# Patient Record
Sex: Male | Born: 1965
Health system: Southern US, Community
[De-identification: ages and names within clinical notes are randomized; demographics above are authoritative.]

## PROBLEM LIST (undated history)

## (undated) DIAGNOSIS — F319 Bipolar disorder, unspecified: Secondary | ICD-10-CM

## (undated) DIAGNOSIS — I10 Essential (primary) hypertension: Secondary | ICD-10-CM

## (undated) DIAGNOSIS — R001 Bradycardia, unspecified: Secondary | ICD-10-CM

## (undated) DIAGNOSIS — G4733 Obstructive sleep apnea (adult) (pediatric): Secondary | ICD-10-CM

## (undated) HISTORY — DX: Bipolar disorder, unspecified: F31.9

## (undated) HISTORY — DX: Obstructive sleep apnea (adult) (pediatric): G47.33

## (undated) HISTORY — PX: KNEE SURGERY: SHX244

---

## 2002-12-22 ENCOUNTER — Encounter: Payer: Self-pay | Admitting: Family Medicine

## 2002-12-22 ENCOUNTER — Ambulatory Visit (HOSPITAL_COMMUNITY): Admission: RE | Admit: 2002-12-22 | Discharge: 2002-12-22 | Payer: Self-pay | Admitting: Family Medicine

## 2006-05-31 ENCOUNTER — Emergency Department (HOSPITAL_COMMUNITY): Admission: EM | Admit: 2006-05-31 | Discharge: 2006-05-31 | Payer: Self-pay | Admitting: Emergency Medicine

## 2016-01-06 DIAGNOSIS — D1801 Hemangioma of skin and subcutaneous tissue: Secondary | ICD-10-CM | POA: Diagnosis not present

## 2016-01-06 DIAGNOSIS — D235 Other benign neoplasm of skin of trunk: Secondary | ICD-10-CM | POA: Diagnosis not present

## 2016-01-06 DIAGNOSIS — L814 Other melanin hyperpigmentation: Secondary | ICD-10-CM | POA: Diagnosis not present

## 2016-01-06 DIAGNOSIS — L821 Other seborrheic keratosis: Secondary | ICD-10-CM | POA: Diagnosis not present

## 2016-01-06 DIAGNOSIS — L570 Actinic keratosis: Secondary | ICD-10-CM | POA: Diagnosis not present

## 2016-02-22 DIAGNOSIS — Z9622 Myringotomy tube(s) status: Secondary | ICD-10-CM | POA: Diagnosis not present

## 2016-02-22 DIAGNOSIS — H6122 Impacted cerumen, left ear: Secondary | ICD-10-CM | POA: Diagnosis not present

## 2016-02-22 DIAGNOSIS — H9211 Otorrhea, right ear: Secondary | ICD-10-CM | POA: Diagnosis not present

## 2016-02-25 DIAGNOSIS — E782 Mixed hyperlipidemia: Secondary | ICD-10-CM | POA: Diagnosis not present

## 2016-02-25 DIAGNOSIS — I10 Essential (primary) hypertension: Secondary | ICD-10-CM | POA: Diagnosis not present

## 2016-03-28 DIAGNOSIS — G4733 Obstructive sleep apnea (adult) (pediatric): Secondary | ICD-10-CM | POA: Diagnosis not present

## 2016-05-29 DIAGNOSIS — Z23 Encounter for immunization: Secondary | ICD-10-CM | POA: Diagnosis not present

## 2016-05-29 DIAGNOSIS — E782 Mixed hyperlipidemia: Secondary | ICD-10-CM | POA: Diagnosis not present

## 2016-05-29 DIAGNOSIS — I10 Essential (primary) hypertension: Secondary | ICD-10-CM | POA: Diagnosis not present

## 2016-05-29 DIAGNOSIS — H6120 Impacted cerumen, unspecified ear: Secondary | ICD-10-CM | POA: Diagnosis not present

## 2016-05-29 DIAGNOSIS — M79671 Pain in right foot: Secondary | ICD-10-CM | POA: Diagnosis not present

## 2016-06-05 DIAGNOSIS — H6522 Chronic serous otitis media, left ear: Secondary | ICD-10-CM | POA: Diagnosis not present

## 2016-06-05 DIAGNOSIS — H9211 Otorrhea, right ear: Secondary | ICD-10-CM | POA: Diagnosis not present

## 2016-06-05 DIAGNOSIS — Z9622 Myringotomy tube(s) status: Secondary | ICD-10-CM | POA: Diagnosis not present

## 2016-07-18 DIAGNOSIS — G4733 Obstructive sleep apnea (adult) (pediatric): Secondary | ICD-10-CM | POA: Diagnosis not present

## 2016-08-24 DIAGNOSIS — I10 Essential (primary) hypertension: Secondary | ICD-10-CM | POA: Diagnosis not present

## 2016-08-24 DIAGNOSIS — F3181 Bipolar II disorder: Secondary | ICD-10-CM | POA: Diagnosis not present

## 2016-08-24 DIAGNOSIS — E782 Mixed hyperlipidemia: Secondary | ICD-10-CM | POA: Diagnosis not present

## 2016-08-24 DIAGNOSIS — M25521 Pain in right elbow: Secondary | ICD-10-CM | POA: Diagnosis not present

## 2016-11-13 DIAGNOSIS — G4733 Obstructive sleep apnea (adult) (pediatric): Secondary | ICD-10-CM | POA: Diagnosis not present

## 2016-11-28 DIAGNOSIS — Z125 Encounter for screening for malignant neoplasm of prostate: Secondary | ICD-10-CM | POA: Diagnosis not present

## 2016-11-28 DIAGNOSIS — I10 Essential (primary) hypertension: Secondary | ICD-10-CM | POA: Diagnosis not present

## 2016-11-28 DIAGNOSIS — F3181 Bipolar II disorder: Secondary | ICD-10-CM | POA: Diagnosis not present

## 2016-11-28 DIAGNOSIS — Z0000 Encounter for general adult medical examination without abnormal findings: Secondary | ICD-10-CM | POA: Diagnosis not present

## 2016-11-28 DIAGNOSIS — R001 Bradycardia, unspecified: Secondary | ICD-10-CM | POA: Diagnosis not present

## 2016-11-28 DIAGNOSIS — E782 Mixed hyperlipidemia: Secondary | ICD-10-CM | POA: Diagnosis not present

## 2016-12-07 DIAGNOSIS — D225 Melanocytic nevi of trunk: Secondary | ICD-10-CM | POA: Diagnosis not present

## 2016-12-07 DIAGNOSIS — B353 Tinea pedis: Secondary | ICD-10-CM | POA: Diagnosis not present

## 2016-12-07 DIAGNOSIS — L814 Other melanin hyperpigmentation: Secondary | ICD-10-CM | POA: Diagnosis not present

## 2016-12-07 DIAGNOSIS — L821 Other seborrheic keratosis: Secondary | ICD-10-CM | POA: Diagnosis not present

## 2017-03-01 DIAGNOSIS — H6122 Impacted cerumen, left ear: Secondary | ICD-10-CM | POA: Diagnosis not present

## 2017-03-26 DIAGNOSIS — H01004 Unspecified blepharitis left upper eyelid: Secondary | ICD-10-CM | POA: Diagnosis not present

## 2017-04-23 DIAGNOSIS — R42 Dizziness and giddiness: Secondary | ICD-10-CM | POA: Diagnosis not present

## 2017-04-23 DIAGNOSIS — F3181 Bipolar II disorder: Secondary | ICD-10-CM | POA: Diagnosis not present

## 2017-04-23 DIAGNOSIS — R001 Bradycardia, unspecified: Secondary | ICD-10-CM | POA: Diagnosis not present

## 2017-05-15 DIAGNOSIS — I10 Essential (primary) hypertension: Secondary | ICD-10-CM | POA: Diagnosis not present

## 2017-05-15 DIAGNOSIS — R42 Dizziness and giddiness: Secondary | ICD-10-CM | POA: Diagnosis not present

## 2017-05-15 DIAGNOSIS — R001 Bradycardia, unspecified: Secondary | ICD-10-CM | POA: Diagnosis not present

## 2017-05-15 DIAGNOSIS — G4733 Obstructive sleep apnea (adult) (pediatric): Secondary | ICD-10-CM | POA: Diagnosis not present

## 2017-05-16 DIAGNOSIS — R42 Dizziness and giddiness: Secondary | ICD-10-CM | POA: Diagnosis not present

## 2017-05-18 DIAGNOSIS — R001 Bradycardia, unspecified: Secondary | ICD-10-CM | POA: Diagnosis not present

## 2017-05-24 DIAGNOSIS — R0602 Shortness of breath: Secondary | ICD-10-CM | POA: Diagnosis not present

## 2017-05-31 DIAGNOSIS — Z23 Encounter for immunization: Secondary | ICD-10-CM | POA: Diagnosis not present

## 2017-05-31 DIAGNOSIS — E782 Mixed hyperlipidemia: Secondary | ICD-10-CM | POA: Diagnosis not present

## 2017-05-31 DIAGNOSIS — I10 Essential (primary) hypertension: Secondary | ICD-10-CM | POA: Diagnosis not present

## 2017-05-31 DIAGNOSIS — F3181 Bipolar II disorder: Secondary | ICD-10-CM | POA: Diagnosis not present

## 2017-06-15 DIAGNOSIS — R001 Bradycardia, unspecified: Secondary | ICD-10-CM | POA: Diagnosis not present

## 2017-06-15 DIAGNOSIS — R42 Dizziness and giddiness: Secondary | ICD-10-CM | POA: Diagnosis not present

## 2017-06-26 DIAGNOSIS — H5203 Hypermetropia, bilateral: Secondary | ICD-10-CM | POA: Diagnosis not present

## 2017-06-26 DIAGNOSIS — H524 Presbyopia: Secondary | ICD-10-CM | POA: Diagnosis not present

## 2017-06-26 DIAGNOSIS — H1789 Other corneal scars and opacities: Secondary | ICD-10-CM | POA: Diagnosis not present

## 2017-07-19 DIAGNOSIS — G4733 Obstructive sleep apnea (adult) (pediatric): Secondary | ICD-10-CM | POA: Diagnosis not present

## 2017-09-13 DIAGNOSIS — G4733 Obstructive sleep apnea (adult) (pediatric): Secondary | ICD-10-CM | POA: Diagnosis not present

## 2017-12-03 DIAGNOSIS — Z1211 Encounter for screening for malignant neoplasm of colon: Secondary | ICD-10-CM | POA: Diagnosis not present

## 2017-12-03 DIAGNOSIS — Z125 Encounter for screening for malignant neoplasm of prostate: Secondary | ICD-10-CM | POA: Diagnosis not present

## 2017-12-03 DIAGNOSIS — I10 Essential (primary) hypertension: Secondary | ICD-10-CM | POA: Diagnosis not present

## 2017-12-03 DIAGNOSIS — R001 Bradycardia, unspecified: Secondary | ICD-10-CM | POA: Diagnosis not present

## 2017-12-03 DIAGNOSIS — F3181 Bipolar II disorder: Secondary | ICD-10-CM | POA: Diagnosis not present

## 2017-12-03 DIAGNOSIS — E782 Mixed hyperlipidemia: Secondary | ICD-10-CM | POA: Diagnosis not present

## 2017-12-03 DIAGNOSIS — Z0000 Encounter for general adult medical examination without abnormal findings: Secondary | ICD-10-CM | POA: Diagnosis not present

## 2017-12-20 DIAGNOSIS — I119 Hypertensive heart disease without heart failure: Secondary | ICD-10-CM | POA: Diagnosis not present

## 2017-12-20 DIAGNOSIS — R001 Bradycardia, unspecified: Secondary | ICD-10-CM | POA: Diagnosis not present

## 2017-12-20 DIAGNOSIS — E785 Hyperlipidemia, unspecified: Secondary | ICD-10-CM | POA: Diagnosis not present

## 2018-01-01 DIAGNOSIS — H9212 Otorrhea, left ear: Secondary | ICD-10-CM | POA: Diagnosis not present

## 2018-01-01 DIAGNOSIS — Z9622 Myringotomy tube(s) status: Secondary | ICD-10-CM | POA: Diagnosis not present

## 2018-03-21 DIAGNOSIS — L819 Disorder of pigmentation, unspecified: Secondary | ICD-10-CM | POA: Diagnosis not present

## 2018-03-21 DIAGNOSIS — D225 Melanocytic nevi of trunk: Secondary | ICD-10-CM | POA: Diagnosis not present

## 2018-03-21 DIAGNOSIS — L821 Other seborrheic keratosis: Secondary | ICD-10-CM | POA: Diagnosis not present

## 2018-04-11 ENCOUNTER — Emergency Department (HOSPITAL_COMMUNITY)
Admission: EM | Admit: 2018-04-11 | Discharge: 2018-04-12 | Disposition: A | Discharge status: Home / Self Care | Payer: BLUE CROSS/BLUE SHIELD | Attending: Emergency Medicine | Admitting: Emergency Medicine

## 2018-04-11 ENCOUNTER — Emergency Department (HOSPITAL_COMMUNITY): Payer: BLUE CROSS/BLUE SHIELD

## 2018-04-11 ENCOUNTER — Encounter (HOSPITAL_COMMUNITY): Payer: Self-pay | Admitting: Emergency Medicine

## 2018-04-11 ENCOUNTER — Other Ambulatory Visit: Payer: Self-pay

## 2018-04-11 DIAGNOSIS — K7689 Other specified diseases of liver: Secondary | ICD-10-CM | POA: Diagnosis not present

## 2018-04-11 DIAGNOSIS — M546 Pain in thoracic spine: Secondary | ICD-10-CM | POA: Diagnosis not present

## 2018-04-11 DIAGNOSIS — R079 Chest pain, unspecified: Secondary | ICD-10-CM | POA: Diagnosis not present

## 2018-04-11 DIAGNOSIS — M549 Dorsalgia, unspecified: Secondary | ICD-10-CM | POA: Diagnosis not present

## 2018-04-11 DIAGNOSIS — R1084 Generalized abdominal pain: Secondary | ICD-10-CM | POA: Diagnosis not present

## 2018-04-11 DIAGNOSIS — R112 Nausea with vomiting, unspecified: Secondary | ICD-10-CM | POA: Diagnosis not present

## 2018-04-11 DIAGNOSIS — K8020 Calculus of gallbladder without cholecystitis without obstruction: Secondary | ICD-10-CM | POA: Insufficient documentation

## 2018-04-11 DIAGNOSIS — R1010 Upper abdominal pain, unspecified: Secondary | ICD-10-CM | POA: Diagnosis not present

## 2018-04-11 DIAGNOSIS — R1011 Right upper quadrant pain: Secondary | ICD-10-CM | POA: Diagnosis not present

## 2018-04-11 HISTORY — DX: Essential (primary) hypertension: I10

## 2018-04-11 HISTORY — DX: Bradycardia, unspecified: R00.1

## 2018-04-11 LAB — CBC
HCT: 43.3 % (ref 39.0–52.0)
HEMOGLOBIN: 14.5 g/dL (ref 13.0–17.0)
MCH: 29.5 pg (ref 26.0–34.0)
MCHC: 33.5 g/dL (ref 30.0–36.0)
MCV: 88.2 fL (ref 78.0–100.0)
PLATELETS: 193 10*3/uL (ref 150–400)
RBC: 4.91 MIL/uL (ref 4.22–5.81)
RDW: 12.7 % (ref 11.5–15.5)
WBC: 8.6 10*3/uL (ref 4.0–10.5)

## 2018-04-11 LAB — COMPREHENSIVE METABOLIC PANEL
ALK PHOS: 80 U/L (ref 38–126)
ALT: 43 U/L (ref 0–44)
ANION GAP: 7 (ref 5–15)
AST: 25 U/L (ref 15–41)
Albumin: 4.5 g/dL (ref 3.5–5.0)
BILIRUBIN TOTAL: 1.1 mg/dL (ref 0.3–1.2)
BUN: 16 mg/dL (ref 6–20)
CALCIUM: 9.6 mg/dL (ref 8.9–10.3)
CO2: 24 mmol/L (ref 22–32)
Chloride: 108 mmol/L (ref 98–111)
Creatinine, Ser: 1.01 mg/dL (ref 0.61–1.24)
GFR calc non Af Amer: >60 mL/min (ref >60)
Glucose, Bld: 158 mg/dL — ABNORMAL HIGH (ref 70–99)
Potassium: 3.6 mmol/L (ref 3.5–5.1)
Sodium: 139 mmol/L (ref 135–145)
TOTAL PROTEIN: 8 g/dL (ref 6.5–8.1)

## 2018-04-11 LAB — TROPONIN I: Troponin I: <0.03 ng/mL (ref <0.03)

## 2018-04-11 LAB — URINALYSIS, ROUTINE W REFLEX MICROSCOPIC
BILIRUBIN URINE: NEGATIVE
Bacteria, UA: NONE SEEN
Glucose, UA: NEGATIVE mg/dL
KETONES UR: NEGATIVE mg/dL
Leukocytes, UA: NEGATIVE
NITRITE: NEGATIVE
PH: 6 (ref 5.0–8.0)
Protein, ur: NEGATIVE mg/dL
Specific Gravity, Urine: 1.006 (ref 1.005–1.030)

## 2018-04-11 LAB — LIPASE, BLOOD: Lipase: 35 U/L (ref 11–51)

## 2018-04-11 MED ORDER — HYDROCODONE-ACETAMINOPHEN 5-325 MG PO TABS: 1.0000 | ORAL_TABLET | Freq: Four times a day (QID) | ORAL | 0 refills | Status: DC | PRN

## 2018-04-11 MED ORDER — IOPAMIDOL (ISOVUE-370) INJECTION 76%
100.0000 mL | Freq: Once | INTRAVENOUS | Status: AC | PRN
  Administered 2018-04-11: 100 mL via INTRAVENOUS

## 2018-04-11 MED ORDER — HYDROCODONE-ACETAMINOPHEN 5-325 MG PO TABS
1.0000 | ORAL_TABLET | Freq: Once | ORAL | Status: AC
  Administered 2018-04-11: 1 via ORAL
  Filled 2018-04-11: qty 1

## 2018-04-11 MED ORDER — IOPAMIDOL (ISOVUE-370) INJECTION 76%
INTRAVENOUS | Status: DC
Start: 2018-04-11 — End: 2018-04-12
  Filled 2018-04-11: qty 100

## 2018-04-11 MED ORDER — SODIUM CHLORIDE 0.9 % IV SOLN
INTRAVENOUS | Status: DC
  Administered 2018-04-11: 20:00:00 via INTRAVENOUS

## 2018-04-11 NOTE — ED Notes (Signed)
Pt stated he does feel short of breath as well

## 2018-04-11 NOTE — ED Triage Notes (Signed)
Patient c/o generalized abdominal pain and thoracic back pain since Monday. Reports N/V on Monday. Denies diarrhea and urinary sx.

## 2018-04-11 NOTE — Discharge Instructions (Addendum)
Call surgery in the morning for a follow-up appointment.  Try to avoid fatty foods.  Hydrocodone provided for pain.  If pain persist for several hours and does not resolve return for evaluation.

## 2018-04-11 NOTE — ED Provider Notes (Addendum)
[facility]-EMERGENCY DEPT Provider Note   CSN: [id number] Arrival date & time: 04/11/18  1627     History   Chief Complaint Chief Complaint  Patient presents with  . Abdominal Pain  . Back Pain    HPI Nicholas Riley is a 52 y.o. male.  Patient with onset of upper abdominal pain left and right radiating to the thoracic back area that started on Monday.  On Monday it was more severe and there was episodes of nausea and vomiting no diarrhea no urinary symptoms no past history of similar pain.  Patient seemed to be in the junction between the chest and the upper abdomen and radiated to the back.  No fevers.  No further nausea or vomiting.  Still has discomfort in that area.     Past Medical History:  Diagnosis Date  . Bradycardia   . Hypertension     There are no active problems to display for this patient.   Past Surgical History:  Procedure Laterality Date  . KNEE SURGERY          Home Medications    Prior to Admission medications   Medication Sig Start Date End Date Taking? Authorizing Provider  amLODipine (NORVASC) 5 MG tablet Take 5 mg by mouth daily. 12/13/17  Yes [provider]  fenofibrate 160 MG tablet Take 160 mg by mouth daily. 12/08/16  Yes [provider]  fluticasone (FLONASE) 50 MCG/ACT nasal spray Place 2 sprays into both nostrils daily.   Yes [provider]  ibuprofen (ADVIL,MOTRIN) 200 MG tablet Take 400 mg by mouth daily as needed for moderate pain.   Yes [provider]  lamoTRIgine (LAMICTAL) 100 MG tablet Take 100 mg by mouth daily. 11/28/16  Yes [provider]  lithium carbonate 300 MG capsule Take 300 mg by mouth daily. 11/28/16  Yes [provider]  losartan (COZAAR) 100 MG tablet Take 100 mg by mouth daily. 12/08/16  Yes [provider]  HYDROcodone-acetaminophen (NORCO/VICODIN) 5-325 MG tablet Take 1 tablet by mouth every 6 (six) hours as needed for moderate  pain. 04/11/18   Vanetta Mulders, MD    Family History No family history on file.  Social History Social History   Tobacco Use  . Smoking status: Not on file  Substance Use Topics  . Alcohol use: Not on file  . Drug use: Not on file     Allergies   Patient has no known allergies.   Review of Systems Review of Systems  Constitutional: Negative for fever.  HENT: Negative for congestion.   Eyes: Negative for redness and visual disturbance.  Respiratory: Negative for shortness of breath.   Cardiovascular: Positive for chest pain.  Gastrointestinal: Positive for abdominal pain, nausea and vomiting.  Genitourinary: Negative for dysuria and hematuria.  Musculoskeletal: Positive for back pain. Negative for neck pain.  Skin: Negative for rash.  Neurological: Negative for syncope and headaches.  Hematological: Does not bruise/bleed easily.  Psychiatric/Behavioral: Negative for confusion.     Physical Exam Updated Vital Signs BP 116/89 (BP Location: Left Arm)   Pulse (!) 54   Temp 98.4 F (36.9 C) (Oral)   Resp 16   Ht 1.854 m (6\' 1" )   Wt 120.2 kg (265 lb)   SpO2 99%   BMI 34.96 kg/m   Physical Exam  Constitutional: He is oriented to person, place, and time. He appears well-developed and well-nourished. No distress.  HENT:  Head: Normocephalic and atraumatic.  Eyes: Pupils  are equal, round, and reactive to light. Conjunctivae and EOM are normal.  Neck: Neck supple.  Cardiovascular: Normal rate, regular rhythm and normal heart sounds.  Pulmonary/Chest: Effort normal and breath sounds normal.  Abdominal: Soft. Bowel sounds are normal. There is tenderness.  Mild tenderness to palpation right upper quadrant.  No guarding  Musculoskeletal: Normal range of motion. He exhibits no edema.  Neurological: He is alert and oriented to person, place, and time. No cranial nerve deficit or sensory deficit. He exhibits normal muscle tone. Coordination normal.  Skin: Skin is warm.    Nursing note and vitals reviewed.    ED Treatments / Results  Labs (all labs ordered are listed, but only abnormal results are displayed) Labs Reviewed  COMPREHENSIVE METABOLIC PANEL - Abnormal; Notable for the following components:      Result Value   Glucose, Bld 158 (*)    All other components within normal limits  URINALYSIS, ROUTINE W REFLEX MICROSCOPIC - Abnormal; Notable for the following components:   Color, Urine STRAW (*)    Hgb urine dipstick SMALL (*)    All other components within normal limits  LIPASE, BLOOD  CBC  TROPONIN I    EKG EKG Interpretation  Date/Time:  Thursday April 11 2018 19:56:15 EDT Ventricular Rate:  51 PR Interval:    QRS Duration: 116 QT Interval:  459 QTC Calculation: 423 R Axis:   -43 Text Interpretation:  Sinus rhythm Incomplete left bundle branch block No significant change since last tracing Confirmed by Vanetta MuldersZackowski, Rian Koon [id number]) on 04/11/2018 8:24:43 PM   Radiology [place] Abdomen Limited  Result Date: 04/11/2018 CLINICAL DATA:  Right upper quadrant pain EXAM: ULTRASOUND ABDOMEN LIMITED RIGHT UPPER QUADRANT COMPARISON:  None. FINDINGS: Gallbladder: There is a single mobile gallstone that measures up to 1.1 cm. No pericholecystic fluid or gallbladder wall thickening. Negative sonographic Eulah PontMurphy sign was reported by the sonographer. Common bile duct: Diameter: 5 mm Liver: Liver parenchyma is hyperechoic. Portal vein is patent on color Doppler imaging with normal direction of blood flow towards the liver. IMPRESSION: 1. Cholelithiasis without other evidence of acute cholecystitis. 2. Hepatic steatosis. Electronically Signed   By: Deatra RobinsonKevin  Herman M.D.   On: 04/11/2018 22:01   Ct Angio Chest/abd/pel For Dissection W And/or W/wo  Result Date: 04/11/2018 CLINICAL DATA:  52 year old male with generalized abdominal pain, shortness and thoracic pain since Monday. Nausea and vomiting. EXAM: CT ANGIOGRAPHY CHEST, ABDOMEN AND PELVIS TECHNIQUE: Multidetector  CT imaging through the chest, abdomen and pelvis was performed using the standard protocol during bolus administration of intravenous contrast. Multiplanar reconstructed images and MIPs were obtained and reviewed to evaluate the vascular anatomy. CONTRAST:  100mL ISOVUE-370 IOPAMIDOL (ISOVUE-370) INJECTION 76% COMPARISON:  None. FINDINGS: CTA CHEST FINDINGS Cardiovascular: Conventional branch pattern of the great vessels without dissection nor aneurysm. Nonaneurysmal thoracic aorta without evidence of dissection. Motion related artifacts noted along the ascending thoracic aorta and aortic root slightly limits assessment. No large central pulmonary embolus identified to the proximal segmental levels. Heart size is normal without pericardial effusion. Coronary arteries demonstrate no significant calcific arteriosclerosis. Mediastinum/Nodes: Hypodense nodules of the left thyroid lobe, the largest in the midpole measuring up to 22 x 19 mm. Midline patent trachea and mainstem bronchi. No lymphadenopathy. Lungs/Pleura: 3.4 mm in average right lo upper lobe posterior pleural-based nodular density potentially postinflammatory or infectious in etiology. Neoplasm believed less likely. No pulmonary consolidation, effusion or pneumothorax. Musculoskeletal: No chest wall abnormality. No acute or significant osseous findings. Review of the MIP  images confirms the above findings. CTA ABDOMEN AND PELVIS FINDINGS VASCULAR Aorta: Normal caliber aorta without aneurysm, dissection, vasculitis or significant stenosis. Celiac: Patent without evidence of aneurysm, dissection, vasculitis or significant stenosis. SMA: Patent without evidence of aneurysm, dissection, vasculitis or significant stenosis. Renals: Both renal arteries are patent without evidence of aneurysm, dissection, vasculitis, fibromuscular dysplasia or significant stenosis. IMA: Patent without evidence of aneurysm, dissection, vasculitis or significant stenosis. Inflow:  Patent without evidence of aneurysm, dissection, vasculitis or significant stenosis. Veins: No obvious venous abnormality within the limitations of this arterial phase study. Review of the MIP images confirms the above findings. NON-VASCULAR Hepatobiliary: Steatosis of the liver. Gallbladder wall thickening with pericholecystic inflammation and gallbladder distention is identified. No gallstones however are noted. Pancreas: Normal Spleen: Normal Adrenals/Urinary Tract: Normal bilateral adrenal glands. Tiny too small to characterize hypodensities within both kidneys compatible with cysts. No obstructive uropathy. The urinary bladder is physiologically distended without focal mural thickening or calculi. Stomach/Bowel: The stomach is decompressed. Normal small bowel rotation is identified. Normal appendix. Average amount of fecal retention within the colon without inflammation or obstruction. Scattered colonic diverticulosis is noted without acute diverticulitis. Lymphatic: No lymphadenopathy by CT size criteria. Reproductive: Normal size prostate and seminal vesicles. Other: Fat containing left inguinal canal and small fat containing umbilical hernia. Musculoskeletal: L5-S1 degenerative disc disease. Lipoma of the left pectineus muscle measuring 3.1 x 5.6 x 2.3 cm Review of the MIP images confirms the above findings. IMPRESSION: Vascular: No acute aortic dissection, aneurysm or pulmonary embolus. Nonvascular: 1. Distended gallbladder with mild pericholecystic inflammatory change suspicious for an acalculous cholecystitis. 2. Hepatic steatosis. 3. Subcentimeter hypodensities within both kidneys statistically consistent with cysts but too small to further characterize. No obstructive uropathy. 4. Scattered colonic diverticulosis without acute diverticulitis. 5. Lipoma of the left pectineus muscle. Electronically Signed   By: Tollie Eth M.D.   On: 04/11/2018 20:46    Procedures Procedures (including critical care  time)  Medications Ordered in ED Medications  0.9 %  sodium chloride infusion ( Intravenous Rate/Dose Verify 04/11/18 2115)  iopamidol (ISOVUE-370) 76 % injection (has no administration in time range)  iopamidol (ISOVUE-370) 76 % injection 100 mL (100 mLs Intravenous Contrast Given 04/11/18 2020)     Initial Impression / Assessment and Plan / ED Course  I have reviewed the triage vital signs and the nursing notes.  Pertinent labs & imaging results that were available during my care of the patient were reviewed by me and considered in my medical decision making (see chart for details).     Although patient was still tender right upper quadrant rest of symptoms were little atypical and that included pretty significant pain left upper quadrant right upper quadrant and lower part of chest wall bilaterally radiating to the back.  Based on this there was some concern for possible dissection so patient had CT NGO chest abdomen and pelvis.  Findings on the CT scan showed gallbladder wall edema but no gallstones.  Because of this patient underwent ultrasound which showed gallstone and no evidence of any gallbladder edema or abnormal common bile duct.  So not consistent with cholecystitis.  However did confirm that most likely patient's symptoms were secondary to gallbladder disease.  Patient's labs without significant abnormalities liver function tests were normal.  White count was normal troponin was negative.  EKG without acute findings.  Also CT angios chest showed no evidence of dissection or pulmonary embolus.  Repeat exam without evidence of significant right upper quadrant tenderness.  Patient will be given referral to general surgery for consideration for gallbladder removal secondary to symptomatic cholelithiasis.  Final Clinical Impressions(s) / ED Diagnoses   Final diagnoses:  Calculus of gallbladder without cholecystitis without obstruction    ED Discharge Orders        Ordered     HYDROcodone-acetaminophen (NORCO/VICODIN) 5-325 MG tablet  Every 6 hours PRN     04/11/18 2318       Vanetta Mulders, MD 04/11/18 1610    Vanetta Mulders, MD 04/11/18 2328

## 2018-04-12 DIAGNOSIS — R1011 Right upper quadrant pain: Secondary | ICD-10-CM | POA: Diagnosis not present

## 2018-04-12 DIAGNOSIS — K8020 Calculus of gallbladder without cholecystitis without obstruction: Secondary | ICD-10-CM | POA: Insufficient documentation

## 2018-04-13 ENCOUNTER — Other Ambulatory Visit: Payer: Self-pay

## 2018-04-13 ENCOUNTER — Emergency Department (HOSPITAL_COMMUNITY)
Admission: EM | Admit: 2018-04-13 | Discharge: 2018-04-13 | Disposition: A | Discharge status: Home / Self Care | Payer: BLUE CROSS/BLUE SHIELD | Source: Home / Self Care | Attending: Emergency Medicine | Admitting: Emergency Medicine

## 2018-04-13 ENCOUNTER — Encounter (HOSPITAL_COMMUNITY): Payer: Self-pay

## 2018-04-13 ENCOUNTER — Emergency Department (HOSPITAL_COMMUNITY): Payer: BLUE CROSS/BLUE SHIELD

## 2018-04-13 DIAGNOSIS — K7689 Other specified diseases of liver: Secondary | ICD-10-CM | POA: Diagnosis not present

## 2018-04-13 DIAGNOSIS — K8020 Calculus of gallbladder without cholecystitis without obstruction: Secondary | ICD-10-CM

## 2018-04-13 DIAGNOSIS — R1011 Right upper quadrant pain: Secondary | ICD-10-CM

## 2018-04-13 LAB — URINALYSIS, ROUTINE W REFLEX MICROSCOPIC
BILIRUBIN URINE: NEGATIVE
Bacteria, UA: NONE SEEN
Glucose, UA: NEGATIVE mg/dL
KETONES UR: NEGATIVE mg/dL
LEUKOCYTES UA: NEGATIVE
Nitrite: NEGATIVE
PH: 6 (ref 5.0–8.0)
Protein, ur: NEGATIVE mg/dL
Specific Gravity, Urine: 1.013 (ref 1.005–1.030)

## 2018-04-13 LAB — CBC
HCT: 42.9 % (ref 39.0–52.0)
HEMOGLOBIN: 14.9 g/dL (ref 13.0–17.0)
MCH: 30.6 pg (ref 26.0–34.0)
MCHC: 34.7 g/dL (ref 30.0–36.0)
MCV: 88.1 fL (ref 78.0–100.0)
PLATELETS: 180 10*3/uL (ref 150–400)
RBC: 4.87 MIL/uL (ref 4.22–5.81)
RDW: 12.7 % (ref 11.5–15.5)
WBC: 10.9 10*3/uL — AB (ref 4.0–10.5)

## 2018-04-13 LAB — COMPREHENSIVE METABOLIC PANEL
ALK PHOS: 79 U/L (ref 38–126)
ALT: 31 U/L (ref 0–44)
ANION GAP: 11 (ref 5–15)
AST: 17 U/L (ref 15–41)
Albumin: 4.1 g/dL (ref 3.5–5.0)
BUN: 14 mg/dL (ref 6–20)
CALCIUM: 9.2 mg/dL (ref 8.9–10.3)
CHLORIDE: 106 mmol/L (ref 98–111)
CO2: 20 mmol/L — ABNORMAL LOW (ref 22–32)
CREATININE: 0.92 mg/dL (ref 0.61–1.24)
Glucose, Bld: 151 mg/dL — ABNORMAL HIGH (ref 70–99)
Potassium: 3.6 mmol/L (ref 3.5–5.1)
Sodium: 137 mmol/L (ref 135–145)
Total Bilirubin: 1.2 mg/dL (ref 0.3–1.2)
Total Protein: 7.5 g/dL (ref 6.5–8.1)

## 2018-04-13 LAB — LIPASE, BLOOD: LIPASE: 26 U/L (ref 11–51)

## 2018-04-13 MED ORDER — ONDANSETRON 8 MG PO TBDP: 8.0000 mg | ORAL_TABLET | Freq: Three times a day (TID) | ORAL | 0 refills | Status: DC | PRN

## 2018-04-13 MED ORDER — ONDANSETRON HCL 4 MG/2ML IJ SOLN
4.0000 mg | Freq: Once | INTRAMUSCULAR | Status: AC
  Administered 2018-04-13: 4 mg via INTRAVENOUS
  Filled 2018-04-13: qty 2

## 2018-04-13 MED ORDER — FENTANYL CITRATE (PF) 100 MCG/2ML IJ SOLN
100.0000 ug | Freq: Once | INTRAMUSCULAR | Status: AC
  Administered 2018-04-13: 100 ug via INTRAVENOUS
  Filled 2018-04-13: qty 2

## 2018-04-13 MED ORDER — OXYCODONE-ACETAMINOPHEN 5-325 MG PO TABS: 1.0000 | ORAL_TABLET | ORAL | 0 refills | Status: DC | PRN

## 2018-04-13 MED ORDER — FENTANYL CITRATE (PF) 100 MCG/2ML IJ SOLN
100.0000 ug | Freq: Once | INTRAMUSCULAR | Status: AC
Start: 2018-04-13 — End: 2018-04-13
  Administered 2018-04-13: 100 ug via INTRAVENOUS
  Filled 2018-04-13: qty 2

## 2018-04-13 NOTE — ED Triage Notes (Signed)
Pt reports that he was seen last night and diagnosed with gallstones. He has taken the prescribed pain medication and now reports that his pain is worse. Pain goes across his upper abdomen into his back. Endorses some nausea, denies vomiting and diarrhea. Tmax of 99.9 at home. Percocet last taken at 1030p. A&Ox4. Ambulatory.

## 2018-04-13 NOTE — ED Notes (Signed)
Ultrasound at bedside

## 2018-04-13 NOTE — ED Provider Notes (Addendum)
[facility]-EMERGENCY DEPT Provider Note: Lowella Dell, MD, FACEP  CSN: [id number] MRN: [id number] ARRIVAL: 04/12/18 at 2327 ROOM: WA20/[facility]20   CHIEF COMPLAINT  Abdominal Pain   HISTORY OF PRESENT ILLNESS  04/13/18 12:58 AM Nicholas Riley is a 52 y.o. male who was evaluated for right upper quadrant abdominal pain 2 days ago and diagnosed with cholelithiasis.  He was prescribed hydrocodone for the pain.  His pain has persisted since discharge and the hydrocodone is not controlling it.  He rates it now as a 7 out of 10, worse with movement or palpation.  There is associated nausea but no vomiting or diarrhea.  He had a fever yesterday at home of 99.9 and 99.1 on arrival here.   Past Medical History:  Diagnosis Date  . Bradycardia   . Hypertension     Past Surgical History:  Procedure Laterality Date  . KNEE SURGERY      History reviewed. No pertinent family history.  Social History   Tobacco Use  . Smoking status: Never Smoker  . Smokeless tobacco: Never Used  Substance Use Topics  . Alcohol use: Not on file  . Drug use: Not on file    Prior to Admission medications   Medication Sig Start Date End Date Taking? Authorizing Provider  amLODipine (NORVASC) 5 MG tablet Take 5 mg by mouth daily. 12/13/17   [provider]  fenofibrate 160 MG tablet Take 160 mg by mouth daily. 12/08/16   [provider]  fluticasone (FLONASE) 50 MCG/ACT nasal spray Place 2 sprays into both nostrils daily.    [provider]  HYDROcodone-acetaminophen (NORCO/VICODIN) 5-325 MG tablet Take 1 tablet by mouth every 6 (six) hours as needed for moderate pain. 04/11/18   Vanetta Mulders, MD  ibuprofen (ADVIL,MOTRIN) 200 MG tablet Take 400 mg by mouth daily as needed for moderate pain.    [provider]  lamoTRIgine (LAMICTAL) 100 MG tablet Take 100 mg by mouth daily. 11/28/16   [provider]  lithium carbonate 300 MG capsule Take 300 mg by mouth daily. 11/28/16    [provider]  losartan (COZAAR) 100 MG tablet Take 100 mg by mouth daily. 12/08/16   [provider]    Allergies Patient has no known allergies.   REVIEW OF SYSTEMS  Negative except as noted here or in the History of Present Illness.   PHYSICAL EXAMINATION  Initial Vital Signs Blood pressure (!) 150/76, pulse 61, temperature 99.1 F (37.3 C), temperature source Oral, resp. rate 16, height 6' (1.829 m), weight 118.2 kg (260 lb 8 oz), SpO2 97 %.  Examination General: Well-developed, well-nourished male in no acute distress; appearance consistent with age of record HENT: normocephalic; atraumatic Eyes: pupils equal, round and reactive to light; extraocular muscles intact Neck: supple Heart: regular rate and rhythm Lungs: clear to auscultation bilaterally Abdomen: soft; nondistended; right upper quadrant tenderness with positive Murphy sign; bowel sounds present Extremities: No deformity; full range of motion; pulses normal Neurologic: Awake, alert and oriented; motor function intact in all extremities and symmetric; no facial droop Skin: Warm and dry Psychiatric: Normal mood and affect   RESULTS  Summary of this visit's results, reviewed by myself:   EKG Interpretation  Date/Time:    Ventricular Rate:    PR Interval:    QRS Duration:   QT Interval:    QTC Calculation:   R Axis:     Text Interpretation:        Laboratory Studies: Results for orders  placed or performed during the hospital encounter of 04/13/18 (from the past 24 hour(s))  Lipase, blood     Status: None   Collection Time: 04/13/18 12:20 AM  Result Value Ref Range   Lipase 26 11 - 51 U/L  Comprehensive metabolic panel     Status: Abnormal   Collection Time: 04/13/18 12:20 AM  Result Value Ref Range   Sodium 137 135 - 145 mmol/L   Potassium 3.6 3.5 - 5.1 mmol/L   Chloride 106 98 - 111 mmol/L   CO2 20 (L) 22 - 32 mmol/L   Glucose, Bld 151 (H) 70 - 99 mg/dL   BUN 14 6 - 20 mg/dL     Creatinine, Ser 1.61 0.61 - 1.24 mg/dL   Calcium 9.2 8.9 - 09.6 mg/dL   Total Protein 7.5 6.5 - 8.1 g/dL   Albumin 4.1 3.5 - 5.0 g/dL   AST 17 15 - 41 U/L   ALT 31 0 - 44 U/L   Alkaline Phosphatase 79 38 - 126 U/L   Total Bilirubin 1.2 0.3 - 1.2 mg/dL   GFR calc non Af Amer >60 >60 mL/min   GFR calc Af Amer >60 >60 mL/min   Anion gap 11 5 - 15  CBC     Status: Abnormal   Collection Time: 04/13/18 12:20 AM  Result Value Ref Range   WBC 10.9 (H) 4.0 - 10.5 K/uL   RBC 4.87 4.22 - 5.81 MIL/uL   Hemoglobin 14.9 13.0 - 17.0 g/dL   HCT 04.5 40.9 - 81.1 %   MCV 88.1 78.0 - 100.0 fL   MCH 30.6 26.0 - 34.0 pg   MCHC 34.7 30.0 - 36.0 g/dL   RDW 91.4 78.2 - 95.6 %   Platelets 180 150 - 400 K/uL  Urinalysis, Routine w reflex microscopic     Status: Abnormal   Collection Time: 04/13/18 12:20 AM  Result Value Ref Range   Color, Urine YELLOW YELLOW   APPearance CLEAR CLEAR   Specific Gravity, Urine 1.013 1.005 - 1.030   pH 6.0 5.0 - 8.0   Glucose, UA NEGATIVE NEGATIVE mg/dL   Hgb urine dipstick MODERATE (A) NEGATIVE   Bilirubin Urine NEGATIVE NEGATIVE   Ketones, ur NEGATIVE NEGATIVE mg/dL   Protein, ur NEGATIVE NEGATIVE mg/dL   Nitrite NEGATIVE NEGATIVE   Leukocytes, UA NEGATIVE NEGATIVE   RBC / HPF 6-10 0 - 5 RBC/hpf   WBC, UA 0-5 0 - 5 WBC/hpf   Bacteria, UA NONE SEEN NONE SEEN   Mucus PRESENT    Hyaline Casts, UA PRESENT    Imaging Studies: US Abdomen Limited  Result Date: 04/11/2018 CLINICAL DATA:  Right upper quadrant pain EXAM: ULTRASOUND ABDOMEN LIMITED RIGHT UPPER QUADRANT COMPARISON:  None. FINDINGS: Gallbladder: There is a single mobile gallstone that measures up to 1.1 cm. No pericholecystic fluid or gallbladder wall thickening. Negative sonographic Eulah Pont sign was reported by the sonographer. Common bile duct: Diameter: 5 mm Liver: Liver parenchyma is hyperechoic. Portal vein is patent on color Doppler imaging with normal direction of blood flow towards the liver.  IMPRESSION: 1. Cholelithiasis without other evidence of acute cholecystitis. 2. Hepatic steatosis. Electronically Signed   By: Deatra Robinson M.D.   On: 04/11/2018 22:01   Ct Angio Chest/abd/pel For Dissection W And/or W/wo  Result Date: 04/11/2018 CLINICAL DATA:  52 year old male with generalized abdominal pain, shortness and thoracic pain since Monday. Nausea and vomiting. EXAM: CT ANGIOGRAPHY CHEST, ABDOMEN AND PELVIS TECHNIQUE: Multidetector CT imaging through the  chest, abdomen and pelvis was performed using the standard protocol during bolus administration of intravenous contrast. Multiplanar reconstructed images and MIPs were obtained and reviewed to evaluate the vascular anatomy. CONTRAST:  100mL ISOVUE-370 IOPAMIDOL (ISOVUE-370) INJECTION 76% COMPARISON:  None. FINDINGS: CTA CHEST FINDINGS Cardiovascular: Conventional branch pattern of the great vessels without dissection nor aneurysm. Nonaneurysmal thoracic aorta without evidence of dissection. Motion related artifacts noted along the ascending thoracic aorta and aortic root slightly limits assessment. No large central pulmonary embolus identified to the proximal segmental levels. Heart size is normal without pericardial effusion. Coronary arteries demonstrate no significant calcific arteriosclerosis. Mediastinum/Nodes: Hypodense nodules of the left thyroid lobe, the largest in the midpole measuring up to 22 x 19 mm. Midline patent trachea and mainstem bronchi. No lymphadenopathy. Lungs/Pleura: 3.4 mm in average right lo upper lobe posterior pleural-based nodular density potentially postinflammatory or infectious in etiology. Neoplasm believed less likely. No pulmonary consolidation, effusion or pneumothorax. Musculoskeletal: No chest wall abnormality. No acute or significant osseous findings. Review of the MIP images confirms the above findings. CTA ABDOMEN AND PELVIS FINDINGS VASCULAR Aorta: Normal caliber aorta without aneurysm, dissection, vasculitis  or significant stenosis. Celiac: Patent without evidence of aneurysm, dissection, vasculitis or significant stenosis. SMA: Patent without evidence of aneurysm, dissection, vasculitis or significant stenosis. Renals: Both renal arteries are patent without evidence of aneurysm, dissection, vasculitis, fibromuscular dysplasia or significant stenosis. IMA: Patent without evidence of aneurysm, dissection, vasculitis or significant stenosis. Inflow: Patent without evidence of aneurysm, dissection, vasculitis or significant stenosis. Veins: No obvious venous abnormality within the limitations of this arterial phase study. Review of the MIP images confirms the above findings. NON-VASCULAR Hepatobiliary: Steatosis of the liver. Gallbladder wall thickening with pericholecystic inflammation and gallbladder distention is identified. No gallstones however are noted. Pancreas: Normal Spleen: Normal Adrenals/Urinary Tract: Normal bilateral adrenal glands. Tiny too small to characterize hypodensities within both kidneys compatible with cysts. No obstructive uropathy. The urinary bladder is physiologically distended without focal mural thickening or calculi. Stomach/Bowel: The stomach is decompressed. Normal small bowel rotation is identified. Normal appendix. Average amount of fecal retention within the colon without inflammation or obstruction. Scattered colonic diverticulosis is noted without acute diverticulitis. Lymphatic: No lymphadenopathy by CT size criteria. Reproductive: Normal size prostate and seminal vesicles. Other: Fat containing left inguinal canal and small fat containing umbilical hernia. Musculoskeletal: L5-S1 degenerative disc disease. Lipoma of the left pectineus muscle measuring 3.1 x 5.6 x 2.3 cm Review of the MIP images confirms the above findings. IMPRESSION: Vascular: No acute aortic dissection, aneurysm or pulmonary embolus. Nonvascular: 1. Distended gallbladder with mild pericholecystic inflammatory  change suspicious for an acalculous cholecystitis. 2. Hepatic steatosis. 3. Subcentimeter hypodensities within both kidneys statistically consistent with cysts but too small to further characterize. No obstructive uropathy. 4. Scattered colonic diverticulosis without acute diverticulitis. 5. Lipoma of the left pectineus muscle. Electronically Signed   By: Tollie Ethavid  Kwon M.D.   On: 04/11/2018 20:46   [place] Abdomen Limited Ruq  Result Date: 04/13/2018 CLINICAL DATA:  3 day history of abdominal pain EXAM: ULTRASOUND ABDOMEN LIMITED RIGHT UPPER QUADRANT COMPARISON:  04/11/2018 FINDINGS: Gallbladder: Gallstone evident without gallbladder wall thickening or pericholecystic fluid. The sonographer reports no sonographic Murphy sign. Common bile duct: Diameter: 6 mm Liver: Increased parenchymal echotexture compatible with fatty deposition. Portal vein is patent on color Doppler imaging with normal direction of blood flow towards the liver. IMPRESSION: 1. Cholelithiasis without gallbladder wall thickening or pericholecystic fluid. 2. Echogenic liver suggests hepatic steatosis. Electronically Signed  By: Kennith Center M.D.   On: 04/13/2018 02:34    ED COURSE and MDM  Nursing notes and initial vitals signs, including pulse oximetry, reviewed.  Vitals:   04/12/18 2344 04/13/18 0006 04/13/18 0203 04/13/18 0240  BP: (!) 150/76  138/75 (!) 148/73  Pulse: 61  (!) 56 (!) 53  Resp: 16   16  Temp: 99.2 F (37.3 C) 99.1 F (37.3 C)    TempSrc:  Oral    SpO2: 97%  95% 94%  Weight: 118.2 kg (260 lb 8 oz)     Height: 6' (1.829 m)      3:20 AM Discussed with Dr. Gerrit [facility] of [facility] surgery.  He advises patient follow-up in the acute clinic for evaluation for elective cholecystectomy.  We will place him on a stronger analgesic in the meantime.  Consultation with the [facility] state controlled substances database reveals the patient has received no opioid prescriptions in the past 2 years; recent prescription  for hydrocodone is likely not yet shown on the database.   PROCEDURES    ED DIAGNOSES     ICD-10-CM   1. Calculus of gallbladder without cholecystitis without obstruction K80.20   2. RUQ abdominal pain R10.11 US Abdomen Limited RUQ    US Abdomen Limited RUQ       Avaley Coop, MD 04/13/18 0321    Paula Libra, MD 04/13/18 [phone]

## 2018-04-13 NOTE — ED Notes (Signed)
ED Provider at bedside. 

## 2018-04-13 NOTE — ED Notes (Signed)
Urine cultures sent down with U/A.  

## 2018-04-23 ENCOUNTER — Encounter: Payer: Self-pay | Admitting: Surgery

## 2018-04-23 ENCOUNTER — Ambulatory Visit: Payer: Self-pay | Admitting: Surgery

## 2018-04-23 DIAGNOSIS — K8010 Calculus of gallbladder with chronic cholecystitis without obstruction: Secondary | ICD-10-CM | POA: Insufficient documentation

## 2018-04-23 DIAGNOSIS — F3170 Bipolar disorder, currently in remission, most recent episode unspecified: Secondary | ICD-10-CM

## 2018-04-23 DIAGNOSIS — G4733 Obstructive sleep apnea (adult) (pediatric): Secondary | ICD-10-CM

## 2018-04-23 DIAGNOSIS — E669 Obesity, unspecified: Secondary | ICD-10-CM

## 2018-04-23 DIAGNOSIS — F319 Bipolar disorder, unspecified: Secondary | ICD-10-CM

## 2018-04-23 HISTORY — DX: Obstructive sleep apnea (adult) (pediatric): G47.33

## 2018-04-23 HISTORY — DX: Bipolar disorder, unspecified: F31.9

## 2018-04-23 NOTE — H&P (View-Only) (Signed)
Nicholas Riley Documented: 04/23/2018 9:36 AM Location: [facility] Patient #: [id number] DOB: 07/24/1966 Married / Language: [place] / Race: White Male  History of Present Illness Nicholas Riley(Nicholas Vallance C. Ilyas Lipsitz MD; 04/23/2018 10:07 AM) The patient is a 52 year old male who presents for evaluation of gall stones. Note for "Gall stones": ` ` ` Patient sent for surgical consultation at the request of Nicholas Riley, Nicholas Riley  Chief Complaint: Biliary colic with probable chronic cholecystitis ` ` The patient is a pleasant active male. Middle school Runner, broadcasting/film/videoteacher. Had episode of upper abdominal pain and nausea or vomiting. Thought initially was a flu bug. Pain persisted. Started in the right upper quadrant. Became unbearable. Went to the emergency room. CT angiogram negative for any PE. No evidence of myocardial infarction. Some gallbladder wall thickening noted. Ultrasound confirmed gallstones but no definite cholecystitis. He seemed to improve. Recommended outpatient surgical consultation. Still was on uncomfortable and went back to emergency room. Repeat workup negative for any definite cholecystitis. He comes in the following week. He notes the tach eventually went away and he's feeling better. He's been trying to adjust to a low-fat diet. He recalls that the pain was worst in the right upper quadrant. Related to his back. Felt full and bloated. Cannot remember what food triggered it was suspected he had a heavy meal. [name] heartburn or reflux. No dysphasia solids or liquids. Or any abdominal surgery. Usually moves his bowels every morning. He's been a little constipated when he was on the narcotics going every other day. He is rather physical active and walk at least half hour hour without difficulty. He does have a history of bipolar disorder but is been on lithium for several decades. That is prevented any issues in 23 years. Had some mild bradycardia that was treated evaluated by his  cardiologist. That is been stable.  No personal nor family history of GI/colon cancer, inflammatory bowel disease, irritable bowel syndrome, allergy such as Celiac Sprue, dietary/dairy problems, colitis, ulcers nor gastritis. No recent sick contacts/gastroenteritis. No travel outside the country. No changes in diet. No dysphagia to solids or liquids. No significant heartburn or reflux. No hematochezia, hematemesis, coffee ground emesis. No evidence of prior gastric/peptic ulceration.  (Review of systems as stated in this history (HPI) or in the review of systems. Otherwise all other 12 point ROS are negative) ` ` `   Past Surgical History (Nicholas Riley, RMA; 04/23/2018 9:36 AM) Colon Polyp Removal - Open Knee Surgery Left. Oral Surgery  Diagnostic Studies History (Nicholas Riley, RMA; 04/23/2018 9:36 AM) Colonoscopy 1-5 years ago  Allergies (Nicholas Riley, RMA; 04/23/2018 9:37 AM) No Known Drug Allergies [04/23/2018]: Allergies Reconciled  Medication History (Nicholas Riley, RMA; 04/23/2018 9:38 AM) AmLODIPine Besylate (5MG  Tablet, Oral) Active. LamoTRIgine (100MG  Tablet, Oral) Active. Lithium Carbonate (300MG  Capsule, Oral) Active. Losartan Potassium (100MG  Tablet, Oral) Active. Ondansetron (8MG  Tablet Disint, Oral) Active. Medications Reconciled  Social History (Nicholas Riley, RMA; 04/23/2018 9:36 AM) Caffeine use Carbonated beverages, Tea. No alcohol use No drug use Tobacco use Never smoker.  Family History (Nicholas Riley, RMA; 04/23/2018 9:36 AM) Arthritis Father, Mother. Melanoma Father. Thyroid problems Mother.  Other Problems (Nicholas Riley, RMA; 04/23/2018 9:36 AM) Depression High blood pressure Hypercholesterolemia Sleep Apnea     Review of Systems (Nicholas A. Brown RMA; 04/23/2018 9:36 AM) General Present- Fatigue. Not Present- Appetite Loss, Chills, Fever, Night Sweats, Weight Gain and Weight Loss. Skin Not  Present- Change in Wart/Mole, Dryness, Hives,  Jaundice, New Lesions, Non-Healing Wounds, Rash and Ulcer. HEENT Present- Seasonal Allergies and Wears glasses/contact lenses. Not Present- Earache, Hearing Loss, Hoarseness, Nose Bleed, Oral Ulcers, Ringing in the Ears, Sinus Pain, Sore Throat, Visual Disturbances and Yellow Eyes. Breast Not Present- Breast Mass, Breast Pain, Nipple Discharge and Skin Changes. Cardiovascular Present- Difficulty Breathing Lying Down and Shortness of Breath. Not Present- Chest Pain, Leg Cramps, Palpitations, Rapid Heart Rate and Swelling of Extremities. Gastrointestinal Present- Abdominal Pain. Not Present- Bloating, Bloody Stool, Change in Bowel Habits, Chronic diarrhea, Constipation, Difficulty Swallowing, Excessive gas, Gets full quickly at meals, Hemorrhoids, Indigestion, Nausea, Rectal Pain and Vomiting. Male Genitourinary Present- Nocturia. Not Present- Blood in Urine, Change in Urinary Stream, Frequency, Impotence, Painful Urination, Urgency and Urine Leakage. Musculoskeletal Not Present- Back Pain, Joint Pain, Joint Stiffness, Muscle Pain, Muscle Weakness and Swelling of Extremities. Neurological Not Present- Decreased Memory, Fainting, Headaches, Numbness, Seizures, Tingling, Tremor, Trouble walking and Weakness. Psychiatric Present- Bipolar. Not Present- Anxiety, Change in Sleep Pattern, Depression, Fearful and Frequent crying. Endocrine Not Present- Cold Intolerance, Excessive Hunger, Hair Changes, Heat Intolerance, Hot flashes and New Diabetes. Hematology Not Present- Blood Thinners, Easy Bruising, Excessive bleeding, Gland problems, HIV and Persistent Infections.  Vitals (Nicholas A. Brown RMA; 04/23/2018 9:37 AM) 04/23/2018 9:37 AM Weight: 261 lb Height: 72in Body Surface Area: 2.39 m Body Mass Index: 35.4 kg/m  Temp.: 98.51F  BP: 128/76 (Sitting, Left Arm, Standard)      Physical Exam Nicholas Riley(Nicholas Maltese C. Georgie Eduardo MD; 04/23/2018 10:03  AM)  General Mental Status-Alert. General Appearance-Not in acute distress, Not Sickly. Orientation-Oriented X3. Hydration-Well hydrated. Voice-Normal.  Integumentary Global Assessment Upon inspection and palpation of skin surfaces of the - Axillae: non-tender, no inflammation or ulceration, no drainage. and Distribution of scalp and body hair is normal. General Characteristics Temperature - normal warmth is noted.  Head and Neck Head-normocephalic, atraumatic with no lesions or palpable masses. Face Global Assessment - atraumatic, no absence of expression. Neck Global Assessment - no abnormal movements, no bruit auscultated on the right, no bruit auscultated on the left, no decreased range of motion, non-tender. Trachea-midline. Thyroid Gland Characteristics - non-tender.  Eye Eyeball - Left-Extraocular movements intact, No Nystagmus. Eyeball - Right-Extraocular movements intact, No Nystagmus. Cornea - Left-No Hazy. Cornea - Right-No Hazy. Sclera/Conjunctiva - Left-No scleral icterus, No Discharge. Sclera/Conjunctiva - Right-No scleral icterus, No Discharge. Pupil - Left-Direct reaction to light normal. Pupil - Right-Direct reaction to light normal.  ENMT Ears Pinna - Left - no drainage observed, no generalized tenderness observed. Right - no drainage observed, no generalized tenderness observed. Nose and Sinuses External Inspection of the Nose - no destructive lesion observed. Inspection of the nares - Left - quiet respiration. Right - quiet respiration. Mouth and Throat Lips - Upper Lip - no fissures observed, no pallor noted. Lower Lip - no fissures observed, no pallor noted. Nasopharynx - no discharge present. Oral Cavity/Oropharynx - Tongue - no dryness observed. Oral Mucosa - no cyanosis observed. Hypopharynx - no evidence of airway distress observed.  Chest and Lung Exam Inspection Movements - Normal and Symmetrical. Accessory muscles  - No use of accessory muscles in breathing. Palpation Palpation of the chest reveals - Non-tender. Auscultation Breath sounds - Normal and Clear.  Cardiovascular Auscultation Rhythm - Regular. Murmurs & Other Heart Sounds - Auscultation of the heart reveals - No Murmurs and No Systolic Clicks.  Abdomen Inspection Inspection of the abdomen reveals - No Visible peristalsis and No Abnormal pulsations. Umbilicus - No Bleeding, No Urine drainage.  Palpation/Percussion Palpation and Percussion of the abdomen reveal - Soft, Non Tender, No Rebound tenderness, No Rigidity (guarding) and No Cutaneous hyperesthesia. Note: Abdomen soft. Obese with moderate supraumbilical diastases recti. Nontender. Not distended. No umbilical or incisional hernias. No guarding.  Male Genitourinary Sexual Maturity Tanner 5 - Adult hair pattern and Adult penile size and shape. Note: No inguinal hernias. No lymphadenopathy.  Peripheral Vascular Upper Extremity Inspection - Left - No Cyanotic nailbeds, Not Ischemic. Right - No Cyanotic nailbeds, Not Ischemic.  Neurologic Neurologic evaluation reveals -normal attention span and ability to concentrate, able to name objects and repeat phrases. Appropriate fund of knowledge , normal sensation and normal coordination. Mental Status Affect - not angry, not paranoid. Cranial Nerves-Normal Bilaterally. Gait-Normal.  Neuropsychiatric Mental status exam performed with findings of-able to articulate well with normal speech/language, rate, volume and coherence, thought content normal with ability to perform basic computations and apply abstract reasoning and no evidence of hallucinations, delusions, obsessions or homicidal/suicidal ideation.  Musculoskeletal Global Assessment Spine, Ribs and Pelvis - no instability, subluxation or laxity. Right Upper Extremity - no instability, subluxation or laxity.  Lymphatic Head & Neck  General Head & Neck Lymphatics:  Bilateral - Description - No Localized lymphadenopathy. Axillary  General Axillary Region: Bilateral - Description - No Localized lymphadenopathy. Femoral & Inguinal  Generalized Femoral & Inguinal Lymphatics: Left - Description - No Localized lymphadenopathy. Right - Description - No Localized lymphadenopathy.    Assessment & Plan Nicholas Sportsman MD; 04/23/2018 10:03 AM)  CHRONIC CHOLECYSTITIS WITH CALCULUS (K80.10) Impression: Rather classic story biliary colic with gallstones. No active pain right now, arguing against severe cholecystitis. The rest of the differential diagnosis seems unlikely.  I think he would benefit from cholecystectomy. Reasonable start I will single site approach. 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 Doubt that we will be able to get this done before then but no work to try and protect recovery  Current Plans You are being scheduled for surgery- Our schedulers will call you.  You should hear from our office's scheduling department within 5 working days about the location, date, and time of surgery. We try to make accommodations for patient's preferences in scheduling surgery, but sometimes the OR schedule or the surgeon's schedule prevents [place] from making those accommodations.  If you have not heard from our office [phone]) in 5 working days, call the office and ask for your surgeon's nurse.  If you have other questions about your diagnosis, plan, or surgery, call the office and ask for your surgeon's nurse.  Written instructions provided Pt Education - Pamphlet Given - Laparoscopic Gallbladder Surgery: discussed with patient and provided information. The anatomy & physiology of hepatobiliary & pancreatic function was discussed. The pathophysiology of gallbladder dysfunction was discussed. Natural history risks without surgery was discussed. I feel the risks of no intervention will lead to serious problems that outweigh the operative risks;  therefore, I recommended cholecystectomy to remove the pathology. I explained laparoscopic techniques with possible need for an open approach. Probable cholangiogram to evaluate the bilary tract was explained as well.  Risks such as bleeding, infection, abscess, leak, injury to other organs, need for further treatment, heart attack, death, and other risks were discussed. I noted a good likelihood this will help address the problem. Possibility that this will not correct all abdominal symptoms was explained. Goals of post-operative recovery were discussed as well. We will work to minimize complications. An educational handout further explaining the pathology and treatment options was given  as well. Questions were answered. The patient expresses understanding & wishes to proceed with surgery.  Pt Education - [facility] Laparosopic Post Op HCI (Dayn Barich) Pt Education - [facility] Good Bowel Health (Coulter Oldaker) Pt Education - Laparoscopic Cholecystectomy: gallbladder  Nicholas Sportsman, MD, FACS, MASCRS Gastrointestinal and Minimally Invasive Surgery    [address], [address] [place], [place] [address] [phone] Main / Paging [phone] Fax

## 2018-04-23 NOTE — H&P (Signed)
Nicholas Riley Documented: 04/23/2018 9:36 AM Location: [facility] Patient #: [id number] DOB: 07/24/1966 Married / Language: [place] / Race: White Male  History of Present Illness Nicholas Riley([name] C. Masayo Fera MD; 04/23/2018 10:07 AM) The patient is a 52 year old male who presents for evaluation of gall stones. Note for "Gall stones": ` ` ` Patient sent for surgical consultation at the request of Dr Read D[place], [facility]  Chief Complaint: Biliary colic with probable chronic cholecystitis ` ` The patient is a pleasant active male. Middle school Runner, broadcasting/film/videoteacher. Had episode of upper abdominal pain and nausea or vomiting. Thought initially was a flu bug. Pain persisted. Started in the right upper quadrant. Became unbearable. Went to the emergency room. CT angiogram negative for any PE. No evidence of myocardial infarction. Some gallbladder wall thickening noted. Ultrasound confirmed gallstones but no definite cholecystitis. He seemed to improve. Recommended outpatient surgical consultation. Still was on uncomfortable and went back to emergency room. Repeat workup negative for any definite cholecystitis. He comes in the following week. He notes the tach eventually went away and he's feeling better. He's been trying to adjust to a low-fat diet. He recalls that the pain was worst in the right upper quadrant. Related to his back. Felt full and bloated. Cannot remember what food triggered it was suspected he had a heavy meal. [name] heartburn or reflux. No dysphasia solids or liquids. Or any abdominal surgery. Usually moves his bowels every morning. He's been a little constipated when he was on the narcotics going every other day. He is rather physical active and walk at least half hour hour without difficulty. He does have a history of bipolar disorder but is been on lithium for several decades. That is prevented any issues in 23 years. Had some mild bradycardia that was treated evaluated by his  cardiologist. That is been stable.  No personal nor family history of GI/colon cancer, inflammatory bowel disease, irritable bowel syndrome, allergy such as Celiac Sprue, dietary/dairy problems, colitis, ulcers nor gastritis. No recent sick contacts/gastroenteritis. No travel outside the country. No changes in diet. No dysphagia to solids or liquids. No significant heartburn or reflux. No hematochezia, hematemesis, coffee ground emesis. No evidence of prior gastric/peptic ulceration.  (Review of systems as stated in this history (HPI) or in the review of systems. Otherwise all other 12 point ROS are negative) ` ` `   Past Surgical History (Nicholas Riley, RMA; 04/23/2018 9:36 AM) Colon Polyp Removal - Open Knee Surgery Left. Oral Surgery  Diagnostic Studies History (Nicholas Riley, RMA; 04/23/2018 9:36 AM) Colonoscopy 1-5 years ago  Allergies (Nicholas Riley, RMA; 04/23/2018 9:37 AM) No Known Drug Allergies [04/23/2018]: Allergies Reconciled  Medication History (Nicholas Riley, RMA; 04/23/2018 9:38 AM) AmLODIPine Besylate (5MG  Tablet, Oral) Active. LamoTRIgine (100MG  Tablet, Oral) Active. Lithium Carbonate (300MG  Capsule, Oral) Active. Losartan Potassium (100MG  Tablet, Oral) Active. Ondansetron (8MG  Tablet Disint, Oral) Active. Medications Reconciled  Social History (Nicholas Riley, RMA; 04/23/2018 9:36 AM) Caffeine use Carbonated beverages, Tea. No alcohol use No drug use Tobacco use Never smoker.  Family History (Nicholas Riley, RMA; 04/23/2018 9:36 AM) Arthritis Father, Mother. Melanoma Father. Thyroid problems Mother.  Other Problems (Nicholas Riley, RMA; 04/23/2018 9:36 AM) Depression High blood pressure Hypercholesterolemia Sleep Apnea     Review of Systems (Nicholas A. Brown RMA; 04/23/2018 9:36 AM) General Present- Fatigue. Not Present- Appetite Loss, Chills, Fever, Night Sweats, Weight Gain and Weight Loss. Skin Not  Present- Change in Wart/Mole, Dryness, Hives,  Jaundice, New Lesions, Non-Healing Wounds, Rash and Ulcer. HEENT Present- Seasonal Allergies and Wears glasses/contact lenses. Not Present- Earache, Hearing Loss, Hoarseness, Nose Bleed, Oral Ulcers, Ringing in the Ears, Sinus Pain, Sore Throat, Visual Disturbances and Yellow Eyes. Breast Not Present- Breast Mass, Breast Pain, Nipple Discharge and Skin Changes. Cardiovascular Present- Difficulty Breathing Lying Down and Shortness of Breath. Not Present- Chest Pain, Leg Cramps, Palpitations, Rapid Heart Rate and Swelling of Extremities. Gastrointestinal Present- Abdominal Pain. Not Present- Bloating, Bloody Stool, Change in Bowel Habits, Chronic diarrhea, Constipation, Difficulty Swallowing, Excessive gas, Gets full quickly at meals, Hemorrhoids, Indigestion, Nausea, Rectal Pain and Vomiting. Male Genitourinary Present- Nocturia. Not Present- Blood in Urine, Change in Urinary Stream, Frequency, Impotence, Painful Urination, Urgency and Urine Leakage. Musculoskeletal Not Present- Back Pain, Joint Pain, Joint Stiffness, Muscle Pain, Muscle Weakness and Swelling of Extremities. Neurological Not Present- Decreased Memory, Fainting, Headaches, Numbness, Seizures, Tingling, Tremor, Trouble walking and Weakness. Psychiatric Present- Bipolar. Not Present- Anxiety, Change in Sleep Pattern, Depression, Fearful and Frequent crying. Endocrine Not Present- Cold Intolerance, Excessive Hunger, Hair Changes, Heat Intolerance, Hot flashes and New Diabetes. Hematology Not Present- Blood Thinners, Easy Bruising, Excessive bleeding, Gland problems, HIV and Persistent Infections.  Vitals (Nicholas A. Brown RMA; 04/23/2018 9:37 AM) 04/23/2018 9:37 AM Weight: 261 lb Height: 72in Body Surface Area: 2.39 m Body Mass Index: 35.4 kg/m  Temp.: 98.51F  BP: 128/76 (Sitting, Left Arm, Standard)      Physical Exam Nicholas Riley(Menashe Kafer C. Lorriann Hansmann MD; 04/23/2018 10:03  AM)  General Mental Status-Alert. General Appearance-Not in acute distress, Not Sickly. Orientation-Oriented X3. Hydration-Well hydrated. Voice-Normal.  Integumentary Global Assessment Upon inspection and palpation of skin surfaces of the - Axillae: non-tender, no inflammation or ulceration, no drainage. and Distribution of scalp and body hair is normal. General Characteristics Temperature - normal warmth is noted.  Head and Neck Head-normocephalic, atraumatic with no lesions or palpable masses. Face Global Assessment - atraumatic, no absence of expression. Neck Global Assessment - no abnormal movements, no bruit auscultated on the right, no bruit auscultated on the left, no decreased range of motion, non-tender. Trachea-midline. Thyroid Gland Characteristics - non-tender.  Eye Eyeball - Left-Extraocular movements intact, No Nystagmus. Eyeball - Right-Extraocular movements intact, No Nystagmus. Cornea - Left-No Hazy. Cornea - Right-No Hazy. Sclera/Conjunctiva - Left-No scleral icterus, No Discharge. Sclera/Conjunctiva - Right-No scleral icterus, No Discharge. Pupil - Left-Direct reaction to light normal. Pupil - Right-Direct reaction to light normal.  ENMT Ears Pinna - Left - no drainage observed, no generalized tenderness observed. Right - no drainage observed, no generalized tenderness observed. Nose and Sinuses External Inspection of the Nose - no destructive lesion observed. Inspection of the nares - Left - quiet respiration. Right - quiet respiration. Mouth and Throat Lips - Upper Lip - no fissures observed, no pallor noted. Lower Lip - no fissures observed, no pallor noted. Nasopharynx - no discharge present. Oral Cavity/Oropharynx - Tongue - no dryness observed. Oral Mucosa - no cyanosis observed. Hypopharynx - no evidence of airway distress observed.  Chest and Lung Exam Inspection Movements - Normal and Symmetrical. Accessory muscles  - No use of accessory muscles in breathing. Palpation Palpation of the chest reveals - Non-tender. Auscultation Breath sounds - Normal and Clear.  Cardiovascular Auscultation Rhythm - Regular. Murmurs & Other Heart Sounds - Auscultation of the heart reveals - No Murmurs and No Systolic Clicks.  Abdomen Inspection Inspection of the abdomen reveals - No Visible peristalsis and No Abnormal pulsations. Umbilicus - No Bleeding, No Urine drainage.  Palpation/Percussion Palpation and Percussion of the abdomen reveal - Soft, Non Tender, No Rebound tenderness, No Rigidity (guarding) and No Cutaneous hyperesthesia. Note: Abdomen soft. Obese with moderate supraumbilical diastases recti. Nontender. Not distended. No umbilical or incisional hernias. No guarding.  Male Genitourinary Sexual Maturity Tanner 5 - Adult hair pattern and Adult penile size and shape. Note: No inguinal hernias. No lymphadenopathy.  Peripheral Vascular Upper Extremity Inspection - Left - No Cyanotic nailbeds, Not Ischemic. Right - No Cyanotic nailbeds, Not Ischemic.  Neurologic Neurologic evaluation reveals -normal attention span and ability to concentrate, able to name objects and repeat phrases. Appropriate fund of knowledge , normal sensation and normal coordination. Mental Status Affect - not angry, not paranoid. Cranial Nerves-Normal Bilaterally. Gait-Normal.  Neuropsychiatric Mental status exam performed with findings of-able to articulate well with normal speech/language, rate, volume and coherence, thought content normal with ability to perform basic computations and apply abstract reasoning and no evidence of hallucinations, delusions, obsessions or homicidal/suicidal ideation.  Musculoskeletal Global Assessment Spine, Ribs and Pelvis - no instability, subluxation or laxity. Right Upper Extremity - no instability, subluxation or laxity.  Lymphatic Head & Neck  General Head & Neck Lymphatics:  Bilateral - Description - No Localized lymphadenopathy. Axillary  General Axillary Region: Bilateral - Description - No Localized lymphadenopathy. Femoral & Inguinal  Generalized Femoral & Inguinal Lymphatics: Left - Description - No Localized lymphadenopathy. Right - Description - No Localized lymphadenopathy.    Assessment & Plan Nicholas Sportsman MD; 04/23/2018 10:03 AM)  CHRONIC CHOLECYSTITIS WITH CALCULUS (K80.10) Impression: Rather classic story biliary colic with gallstones. No active pain right now, arguing against severe cholecystitis. The rest of the differential diagnosis seems unlikely.  I think he would benefit from cholecystectomy. Reasonable start I will single site approach. 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 Doubt that we will be able to get this done before then but no work to try and protect recovery  Current Plans You are being scheduled for surgery- Our schedulers will call you.  You should hear from our office's scheduling department within 5 working days about the location, date, and time of surgery. We try to make accommodations for patient's preferences in scheduling surgery, but sometimes the OR schedule or the surgeon's schedule prevents [place] from making those accommodations.  If you have not heard from our office [phone] in 5 working days, call the office and ask for your surgeon's nurse.  If you have other questions about your diagnosis, plan, or surgery, call the office and ask for your surgeon's nurse.  Written instructions provided Pt Education - Pamphlet Given - Laparoscopic Gallbladder Surgery: discussed with patient and provided information. The anatomy & physiology of hepatobiliary & pancreatic function was discussed. The pathophysiology of gallbladder dysfunction was discussed. Natural history risks without surgery was discussed. I feel the risks of no intervention will lead to serious problems that outweigh the operative risks;  therefore, I recommended cholecystectomy to remove the pathology. I explained laparoscopic techniques with possible need for an open approach. Probable cholangiogram to evaluate the bilary tract was explained as well.  Risks such as bleeding, infection, abscess, leak, injury to other organs, need for further treatment, heart attack, death, and other risks were discussed. I noted a good likelihood this will help address the problem. Possibility that this will not correct all abdominal symptoms was explained. Goals of post-operative recovery were discussed as well. We will work to minimize complications. An educational handout further explaining the pathology and treatment options was given  as well. Questions were answered. The patient expresses understanding & wishes to proceed with surgery.  Pt Education - [facility] Laparosopic Post Op HCI (Yosiel Thieme) Pt Education - [facility] Good Bowel Health (Mark Hassey) Pt Education - Laparoscopic Cholecystectomy: gallbladder  Nicholas Sportsman, MD, FACS, MASCRS Gastrointestinal and Minimally Invasive Surgery    [address] [place], [place] [address] [phone] Main / Paging [phone] Fax

## 2018-04-24 NOTE — Patient Instructions (Addendum)
Oletha BlendWilliam B Esters  04/24/2018   Your procedure is scheduled on: 05-09-18   [facility]  [facility]    Report to Admitting at 7:30 AM    Call this number if you have problems the morning of surgery [phone]   Remember: NO SOLID FOOD AFTER MIDNIGHT THE NIGHT PRIOR TO SURGERY. NOTHING BY MOUTH EXCEPT CLEAR LIQUIDS UNTIL 3 HOURS PRIOR TO SCHEULED SURGERY. PLEASE FINISH ENSURE DRINK PER SURGEON ORDER 3 HOURS PRIOR TO SCHEDULED SURGERY TIME WHICH NEEDS TO BE COMPLETED AT 6:30 AM ____________.   CLEAR LIQUID DIET   Foods Allowed                                                                     Foods Excluded  Coffee and tea, regular and decaf                             liquids that you cannot  Plain Jell-O in any flavor                                             see through such as: Fruit ices (not with fruit pulp)                                     milk, soups, orange juice  Iced Popsicles                                    All solid food Carbonated beverages, regular and diet                                    Cranberry, grape and apple juices Sports drinks like Gatorade Lightly seasoned clear broth or consume(fat free) Sugar, honey syrup  Sample Menu Breakfast                                Lunch                                     Supper Cranberry juice                    Beef broth                            Chicken broth Jell-O                                     Grape juice  Apple juice Coffee or tea                        Jell-O                                      Popsicle                                                Coffee or tea                        Coffee or tea  _____________________________________________________________________      Take these medicines the morning of surgery with A SIP OF WATER: Amlodipine (Norvasc), and Lithium Carbonate. You may also bring and use your nasal spray as needed.                               You may not have any metal on your body including hair pins and              piercings  Do not wear jewelry, lotions, powders, cologne or deodorant             Men may shave face and neck.   Do not bring valuables to the hospital. [place] IS NOT             RESPONSIBLE   FOR VALUABLES.  Contacts, dentures or bridgework may not be worn into surgery.      Patients discharged the day of surgery will not be allowed to drive home.  Name and phone number of your driver:  Special Instructions: N/A              Please read over the following fact sheets you were given: _____________________________________________________________________             [facility] - Pre[facility] Before surgery, you can play an important role.  Because skin is not sterile, your skin needs to be as free of germs as possible.  You can reduce the number of germs on your skin by washing with CHG (chlorahexidine gluconate) soap before surgery.  CHG is an antiseptic cleaner which kills germs and bonds with the skin to continue killing germs even after washing. Please DO NOT use if you have an allergy to CHG or antibacterial soaps.  If your skin becomes reddened/irritated stop using the CHG and inform your nurse when you arrive at Short Stay. Do not shave (including legs and underarms) for at least 48 hours prior to the first CHG shower.  You may shave your face/neck. Please follow these instructions carefully:  1.  Shower with CHG Soap the night before surgery and the  morning of Surgery.  2.  If you choose to wash your hair, wash your hair first as usual with your  normal  shampoo.  3.  After you shampoo, rinse your hair and body thoroughly to remove the  shampoo.                           4.  Use CHG as you would any other liquid soap.  You  can apply chg directly  to the skin and wash                       Gently with a scrungie or clean washcloth.  5.  Apply the CHG Soap to  your body ONLY FROM THE NECK DOWN.   Do not use on face/ open                           Wound or open sores. Avoid contact with eyes, ears mouth and genitals (private parts).                       Wash face,  Genitals (private parts) with your normal soap.             6.  Wash thoroughly, paying special attention to the area where your surgery  will be performed.  7.  Thoroughly rinse your body with warm water from the neck down.  8.  DO NOT shower/wash with your normal soap after using and rinsing off  the CHG Soap.                9.  Pat yourself dry with a clean towel.            10.  Wear clean pajamas.            11.  Place clean sheets on your bed the night of your first shower and do not  sleep with pets. Day of Surgery : Do not apply any lotions/deodorants the morning of surgery.  Please wear clean clothes to the hospital/surgery center.  FAILURE TO FOLLOW THESE INSTRUCTIONS MAY RESULT IN THE CANCELLATION OF YOUR SURGERY PATIENT SIGNATURE_________________________________  NURSE SIGNATURE__________________________________  ________________________________________________________________________

## 2018-04-24 NOTE — Progress Notes (Signed)
04-11-18 (Epic) EKG, CT of abdomen/pelvis  04-13-18 (Epic) CMP

## 2018-04-25 ENCOUNTER — Inpatient Hospital Stay (HOSPITAL_COMMUNITY)
Admission: RE | Admit: 2018-04-25 | Discharge: 2018-04-25 | Disposition: A | Discharge status: Home / Self Care | Payer: BLUE CROSS/BLUE SHIELD | Source: Ambulatory Visit

## 2018-04-26 ENCOUNTER — Encounter (HOSPITAL_COMMUNITY)
Admission: RE | Admit: 2018-04-26 | Discharge: 2018-04-26 | Disposition: A | Discharge status: Home / Self Care | Payer: BLUE CROSS/BLUE SHIELD | Source: Ambulatory Visit | Attending: Surgery | Admitting: Surgery

## 2018-04-26 ENCOUNTER — Other Ambulatory Visit: Payer: Self-pay

## 2018-04-26 ENCOUNTER — Encounter (HOSPITAL_COMMUNITY): Payer: Self-pay

## 2018-04-26 DIAGNOSIS — Z01818 Encounter for other preprocedural examination: Secondary | ICD-10-CM | POA: Diagnosis not present

## 2018-04-26 DIAGNOSIS — K811 Chronic cholecystitis: Secondary | ICD-10-CM | POA: Diagnosis not present

## 2018-04-26 LAB — CBC
HCT: 38.1 % — ABNORMAL LOW (ref 39.0–52.0)
Hemoglobin: 12.7 g/dL — ABNORMAL LOW (ref 13.0–17.0)
MCH: 29.6 pg (ref 26.0–34.0)
MCHC: 33.3 g/dL (ref 30.0–36.0)
MCV: 88.8 fL (ref 78.0–100.0)
Platelets: 197 10*3/uL (ref 150–400)
RBC: 4.29 MIL/uL (ref 4.22–5.81)
RDW: 13.2 % (ref 11.5–15.5)
WBC: 7.2 10*3/uL (ref 4.0–10.5)

## 2018-04-26 NOTE — Patient Instructions (Signed)
Nicholas Riley  04/26/2018   Your procedure is scheduled on: 05-09-18   Report to [facility] Main  Entrance    Report to Admitting at 7:30 AM    Call this number if you have problems the morning of surgery [phone]    Remember: NO SOLID FOOD AFTER MIDNIGHT THE NIGHT PRIOR TO SURGERY. NOTHING BY MOUTH EXCEPT CLEAR LIQUIDS UNTIL 3 HOURS PRIOR TO SCHEULED SURGERY. PLEASE FINISH ENSURE DRINK PER SURGEON ORDER 3 HOURS PRIOR TO SCHEDULED SURGERY TIME WHICH NEEDS TO BE COMPLETED AT 6:30 AM.     CLEAR LIQUID DIET   Foods Allowed                                                                     Foods Excluded  Coffee and tea, regular and decaf                             liquids that you cannot  Plain Jell-O in any flavor                                             see through such as: Fruit ices (not with fruit pulp)                                     milk, soups, orange juice  Iced Popsicles                                    All solid food Carbonated beverages, regular and diet                                    Cranberry, grape and apple juices Sports drinks like Gatorade Lightly seasoned clear broth or consume(fat free) Sugar, honey syrup  Sample Menu Breakfast                                Lunch                                     Supper Cranberry juice                    Beef broth                            Chicken broth Jell-O                                     Grape juice  Apple juice Coffee or tea                        Jell-O                                      Popsicle                                                Coffee or tea                        Coffee or tea  _____________________________________________________________________     Take these medicines the morning of surgery with A SIP OF WATER: Amlodipine (Norvasc), Lithium Carbonate, and Lamotrigine (Lamictal). You may bring and use your pain medication  as needed.                                 You may not have any metal on your body including hair pins and              piercings  Do not wear jewelry, lotions, powders, cologne or deodorant             Men may shave face and neck.   Do not bring valuables to the hospital. [place] IS NOT             RESPONSIBLE   FOR VALUABLES.  Contacts, dentures or bridgework may not be worn into surgery.  Leave suitcase in the car. After surgery it may be brought to your room.     Patients discharged the day of surgery will not be allowed to drive home.  Name and phone number of your driver: Nicholas Riley [phone]  Special Instructions: Please bring and use your mask and tubing for your CPAP machine              Please read over the following fact sheets you were given: _____________________________________________________________________             [facility] - Pre[facility] Before surgery, you can play an important role.  Because skin is not sterile, your skin needs to be as free of germs as possible.  You can reduce the number of germs on your skin by washing with CHG (chlorahexidine gluconate) soap before surgery.  CHG is an antiseptic cleaner which kills germs and bonds with the skin to continue killing germs even after washing. Please DO NOT use if you have an allergy to CHG or antibacterial soaps.  If your skin becomes reddened/irritated stop using the CHG and inform your nurse when you arrive at Short Stay. Do not shave (including legs and underarms) for at least 48 hours prior to the first CHG shower.  You may shave your face/neck. Please follow these instructions carefully:  1.  Shower with CHG Soap the night before surgery and the  morning of Surgery.  2.  If you choose to wash your hair, wash your hair first as usual with your  normal  shampoo.  3.  After you shampoo, rinse your hair and body thoroughly to remove the  shampoo.  4.  Use CHG as you  would any other liquid soap.  You can apply chg directly  to the skin and wash                       Gently with a scrungie or clean washcloth.  5.  Apply the CHG Soap to your body ONLY FROM THE NECK DOWN.   Do not use on face/ open                           Wound or open sores. Avoid contact with eyes, ears mouth and genitals (private parts).                       Wash face,  Genitals (private parts) with your normal soap.             6.  Wash thoroughly, paying special attention to the area where your surgery  will be performed.  7.  Thoroughly rinse your body with warm water from the neck down.  8.  DO NOT shower/wash with your normal soap after using and rinsing off  the CHG Soap.                9.  Pat yourself dry with a clean towel.            10.  Wear clean pajamas.            11.  Place clean sheets on your bed the night of your first shower and do not  sleep with pets. Day of Surgery : Do not apply any lotions/deodorants the morning of surgery.  Please wear clean clothes to the hospital/surgery center.  FAILURE TO FOLLOW THESE INSTRUCTIONS MAY RESULT IN THE CANCELLATION OF YOUR SURGERY PATIENT SIGNATURE_________________________________  NURSE SIGNATURE__________________________________  ________________________________________________________________________

## 2018-05-02 DIAGNOSIS — G4733 Obstructive sleep apnea (adult) (pediatric): Secondary | ICD-10-CM | POA: Diagnosis not present

## 2018-05-08 MED ORDER — BUPIVACAINE LIPOSOME 1.3 % IJ SUSP
20.0000 mL | Freq: Once | INTRAMUSCULAR | Status: DC
  Filled 2018-05-08: qty 20

## 2018-05-09 ENCOUNTER — Ambulatory Visit (HOSPITAL_COMMUNITY)
Admission: RE | Admit: 2018-05-09 | Discharge: 2018-05-09 | Disposition: A | Discharge status: Home / Self Care | Payer: BLUE CROSS/BLUE SHIELD | Source: Ambulatory Visit | Attending: Surgery | Admitting: Surgery

## 2018-05-09 ENCOUNTER — Ambulatory Visit (HOSPITAL_COMMUNITY): Payer: BLUE CROSS/BLUE SHIELD | Admitting: Certified Registered Nurse Anesthetist

## 2018-05-09 ENCOUNTER — Encounter (HOSPITAL_COMMUNITY): Payer: Self-pay

## 2018-05-09 ENCOUNTER — Encounter (HOSPITAL_COMMUNITY)
Admission: RE | Disposition: A | Discharge status: Home / Self Care | Payer: Self-pay | Source: Ambulatory Visit | Attending: Surgery

## 2018-05-09 ENCOUNTER — Ambulatory Visit (HOSPITAL_COMMUNITY): Payer: BLUE CROSS/BLUE SHIELD

## 2018-05-09 DIAGNOSIS — K8010 Calculus of gallbladder with chronic cholecystitis without obstruction: Secondary | ICD-10-CM | POA: Diagnosis not present

## 2018-05-09 DIAGNOSIS — Z7951 Long term (current) use of inhaled steroids: Secondary | ICD-10-CM | POA: Diagnosis not present

## 2018-05-09 DIAGNOSIS — I10 Essential (primary) hypertension: Secondary | ICD-10-CM | POA: Diagnosis not present

## 2018-05-09 DIAGNOSIS — F319 Bipolar disorder, unspecified: Secondary | ICD-10-CM | POA: Insufficient documentation

## 2018-05-09 DIAGNOSIS — G4733 Obstructive sleep apnea (adult) (pediatric): Secondary | ICD-10-CM | POA: Diagnosis not present

## 2018-05-09 DIAGNOSIS — Z86010 Personal history of colonic polyps: Secondary | ICD-10-CM | POA: Diagnosis not present

## 2018-05-09 DIAGNOSIS — K8020 Calculus of gallbladder without cholecystitis without obstruction: Secondary | ICD-10-CM

## 2018-05-09 DIAGNOSIS — G4730 Sleep apnea, unspecified: Secondary | ICD-10-CM | POA: Insufficient documentation

## 2018-05-09 DIAGNOSIS — K8012 Calculus of gallbladder with acute and chronic cholecystitis without obstruction: Secondary | ICD-10-CM | POA: Insufficient documentation

## 2018-05-09 DIAGNOSIS — K8066 Calculus of gallbladder and bile duct with acute and chronic cholecystitis without obstruction: Secondary | ICD-10-CM | POA: Diagnosis not present

## 2018-05-09 DIAGNOSIS — Z79899 Other long term (current) drug therapy: Secondary | ICD-10-CM | POA: Insufficient documentation

## 2018-05-09 HISTORY — PX: LAPAROSCOPIC CHOLECYSTECTOMY SINGLE SITE WITH INTRAOPERATIVE CHOLANGIOGRAM: SHX6538

## 2018-05-09 SURGERY — LAPAROSCOPIC CHOLECYSTECTOMY SINGLE SITE WITH INTRAOPERATIVE CHOLANGIOGRAM
Anesthesia: General | Site: Abdomen

## 2018-05-09 MED ORDER — GLYCOPYRROLATE PF 0.2 MG/ML IJ SOSY
PREFILLED_SYRINGE | INTRAMUSCULAR | Status: DC | PRN
  Administered 2018-05-09 (×2): .2 mg via INTRAVENOUS

## 2018-05-09 MED ORDER — ONDANSETRON HCL 4 MG/2ML IJ SOLN
INTRAMUSCULAR | Status: DC | PRN
  Administered 2018-05-09: 4 mg via INTRAVENOUS

## 2018-05-09 MED ORDER — OXYCODONE-ACETAMINOPHEN 5-325 MG PO TABS
1.0000 | ORAL_TABLET | Freq: Once | ORAL | Status: AC
  Administered 2018-05-09: 1 via ORAL

## 2018-05-09 MED ORDER — DEXAMETHASONE SODIUM PHOSPHATE 10 MG/ML IJ SOLN
INTRAMUSCULAR | Status: DC | PRN
  Administered 2018-05-09: 10 mg via INTRAVENOUS

## 2018-05-09 MED ORDER — ACETAMINOPHEN 500 MG PO TABS
1000.0000 mg | ORAL_TABLET | ORAL | Status: AC
  Administered 2018-05-09: 1000 mg via ORAL
  Filled 2018-05-09: qty 2

## 2018-05-09 MED ORDER — ATROPINE SULFATE 1 MG/10ML IJ SOSY
PREFILLED_SYRINGE | INTRAMUSCULAR | Status: AC
  Filled 2018-05-09: qty 10

## 2018-05-09 MED ORDER — KETAMINE HCL 10 MG/ML IJ SOLN
INTRAMUSCULAR | Status: DC | PRN
  Administered 2018-05-09: 30 mg via INTRAVENOUS

## 2018-05-09 MED ORDER — BUPIVACAINE-EPINEPHRINE (PF) 0.25% -1:200000 IJ SOLN
INTRAMUSCULAR | Status: AC
  Filled 2018-05-09: qty 30

## 2018-05-09 MED ORDER — EPHEDRINE SULFATE-NACL 50-0.9 MG/10ML-% IV SOSY
PREFILLED_SYRINGE | INTRAVENOUS | Status: DC | PRN
  Administered 2018-05-09: 10 mg via INTRAVENOUS

## 2018-05-09 MED ORDER — OXYCODONE-ACETAMINOPHEN 5-325 MG PO TABS
ORAL_TABLET | ORAL | Status: AC
  Filled 2018-05-09: qty 1

## 2018-05-09 MED ORDER — MIDAZOLAM HCL 5 MG/5ML IJ SOLN
INTRAMUSCULAR | Status: DC | PRN
  Administered 2018-05-09: 2 mg via INTRAVENOUS

## 2018-05-09 MED ORDER — ONDANSETRON HCL 4 MG/2ML IJ SOLN
INTRAMUSCULAR | Status: AC
  Filled 2018-05-09: qty 2

## 2018-05-09 MED ORDER — HYDROMORPHONE HCL 1 MG/ML IJ SOLN: 0.2500 mg | INTRAMUSCULAR | Status: DC | PRN

## 2018-05-09 MED ORDER — SUGAMMADEX SODIUM 200 MG/2ML IV SOLN
INTRAVENOUS | Status: DC | PRN
  Administered 2018-05-09: 250 mg via INTRAVENOUS

## 2018-05-09 MED ORDER — CHLORHEXIDINE GLUCONATE CLOTH 2 % EX PADS: 6.0000 | MEDICATED_PAD | Freq: Once | CUTANEOUS | Status: DC

## 2018-05-09 MED ORDER — ONDANSETRON 8 MG PO TBDP: 8.0000 mg | ORAL_TABLET | Freq: Three times a day (TID) | ORAL | 2 refills | Status: AC | PRN

## 2018-05-09 MED ORDER — SODIUM CHLORIDE 0.9 % IR SOLN
Status: DC | PRN
  Administered 2018-05-09: 1000 mL

## 2018-05-09 MED ORDER — PHENYLEPHRINE 40 MCG/ML (10ML) SYRINGE FOR IV PUSH (FOR BLOOD PRESSURE SUPPORT)
PREFILLED_SYRINGE | INTRAVENOUS | Status: AC
  Filled 2018-05-09: qty 10

## 2018-05-09 MED ORDER — ROCURONIUM BROMIDE 10 MG/ML (PF) SYRINGE
PREFILLED_SYRINGE | INTRAVENOUS | Status: DC | PRN
  Administered 2018-05-09: 10 mg via INTRAVENOUS
  Administered 2018-05-09: 50 mg via INTRAVENOUS
  Administered 2018-05-09 (×2): 10 mg via INTRAVENOUS

## 2018-05-09 MED ORDER — IOPAMIDOL (ISOVUE-300) INJECTION 61%
INTRAVENOUS | Status: AC
  Filled 2018-05-09: qty 50

## 2018-05-09 MED ORDER — RINGERS IRRIGATION IR SOLN
Status: DC | PRN
  Administered 2018-05-09: 1

## 2018-05-09 MED ORDER — MEPERIDINE HCL 50 MG/ML IJ SOLN: 6.2500 mg | INTRAMUSCULAR | Status: DC | PRN

## 2018-05-09 MED ORDER — BUPIVACAINE-EPINEPHRINE 0.25% -1:200000 IJ SOLN
INTRAMUSCULAR | Status: DC | PRN
  Administered 2018-05-09: 30 mL

## 2018-05-09 MED ORDER — GABAPENTIN 300 MG PO CAPS
ORAL_CAPSULE | ORAL | Status: AC
  Filled 2018-05-09: qty 1

## 2018-05-09 MED ORDER — PROPOFOL 10 MG/ML IV BOLUS
INTRAVENOUS | Status: DC | PRN
  Administered 2018-05-09: 200 mg via INTRAVENOUS

## 2018-05-09 MED ORDER — KETAMINE HCL 10 MG/ML IJ SOLN
INTRAMUSCULAR | Status: AC
  Filled 2018-05-09: qty 1

## 2018-05-09 MED ORDER — MIDAZOLAM HCL 2 MG/2ML IJ SOLN
INTRAMUSCULAR | Status: AC
  Filled 2018-05-09: qty 2

## 2018-05-09 MED ORDER — SODIUM CHLORIDE 0.9 % IV SOLN
INTRAVENOUS | Status: DC | PRN
  Administered 2018-05-09: 2 g via INTRAVENOUS

## 2018-05-09 MED ORDER — LACTATED RINGERS IV SOLN
INTRAVENOUS | Status: DC
  Administered 2018-05-09 (×3): via INTRAVENOUS

## 2018-05-09 MED ORDER — FENTANYL CITRATE (PF) 100 MCG/2ML IJ SOLN
INTRAMUSCULAR | Status: DC | PRN
  Administered 2018-05-09: 150 ug via INTRAVENOUS
  Administered 2018-05-09 (×2): 50 ug via INTRAVENOUS

## 2018-05-09 MED ORDER — FENTANYL CITRATE (PF) 250 MCG/5ML IJ SOLN
INTRAMUSCULAR | Status: AC
  Filled 2018-05-09: qty 5

## 2018-05-09 MED ORDER — ROCURONIUM BROMIDE 10 MG/ML (PF) SYRINGE
PREFILLED_SYRINGE | INTRAVENOUS | Status: AC
  Filled 2018-05-09: qty 20

## 2018-05-09 MED ORDER — EPHEDRINE 5 MG/ML INJ
INTRAVENOUS | Status: AC
  Filled 2018-05-09: qty 30

## 2018-05-09 MED ORDER — LIDOCAINE 2% (20 MG/ML) 5 ML SYRINGE
INTRAMUSCULAR | Status: DC | PRN
  Administered 2018-05-09: 100 mg via INTRAVENOUS

## 2018-05-09 MED ORDER — BUPIVACAINE LIPOSOME 1.3 % IJ SUSP
INTRAMUSCULAR | Status: DC | PRN
  Administered 2018-05-09: 20 mL

## 2018-05-09 MED ORDER — GABAPENTIN 300 MG PO CAPS
300.0000 mg | ORAL_CAPSULE | ORAL | Status: AC
  Administered 2018-05-09: 300 mg via ORAL
  Filled 2018-05-09: qty 1

## 2018-05-09 MED ORDER — ONDANSETRON HCL 4 MG/2ML IJ SOLN: 4.0000 mg | Freq: Once | INTRAMUSCULAR | Status: DC | PRN

## 2018-05-09 MED ORDER — PROPOFOL 10 MG/ML IV BOLUS
INTRAVENOUS | Status: AC
  Filled 2018-05-09: qty 20

## 2018-05-09 MED ORDER — OXYCODONE-ACETAMINOPHEN 5-325 MG PO TABS: 1.0000 | ORAL_TABLET | Freq: Four times a day (QID) | ORAL | 0 refills | Status: AC | PRN

## 2018-05-09 MED ORDER — SODIUM CHLORIDE 0.9 % IV SOLN
INTRAVENOUS | Status: AC
  Filled 2018-05-09: qty 2

## 2018-05-09 MED ORDER — ATROPINE SULFATE 0.4 MG/ML IJ SOLN
INTRAMUSCULAR | Status: DC | PRN
  Administered 2018-05-09: 0.2 mg via INTRAVENOUS

## 2018-05-09 MED ORDER — CELECOXIB 200 MG PO CAPS
200.0000 mg | ORAL_CAPSULE | ORAL | Status: AC
  Administered 2018-05-09: 200 mg via ORAL
  Filled 2018-05-09: qty 1

## 2018-05-09 SURGICAL SUPPLY — 41 items
APPLIER CLIP 5 13 M/L LIGAMAX5 (MISCELLANEOUS) ×3
APR CLP MED LRG 5 ANG JAW (MISCELLANEOUS) ×1
BAG SPEC RTRVL 10 TROC 200 (ENDOMECHANICALS)
CABLE HIGH FREQUENCY MONO STRZ (ELECTRODE) ×3 IMPLANT
CHLORAPREP W/TINT 26ML (MISCELLANEOUS) ×3 IMPLANT
CLIP APPLIE 5 13 M/L LIGAMAX5 (MISCELLANEOUS) ×1 IMPLANT
COVER MAYO STAND STRL (DRAPES) ×3 IMPLANT
COVER SURGICAL LIGHT HANDLE (MISCELLANEOUS) ×3 IMPLANT
DECANTER SPIKE VIAL GLASS SM (MISCELLANEOUS) ×3 IMPLANT
DRAIN CHANNEL 19F RND (DRAIN) IMPLANT
DRAPE C-ARM 42X120 X-RAY (DRAPES) ×3 IMPLANT
DRAPE WARM FLUID 44X44 (DRAPE) ×3 IMPLANT
DRSG TEGADERM 4X4.75 (GAUZE/BANDAGES/DRESSINGS) ×3 IMPLANT
ELECT REM PT RETURN 15FT ADLT (MISCELLANEOUS) ×3 IMPLANT
ENDOLOOP SUT PDS II  0 18 (SUTURE) ×2
ENDOLOOP SUT PDS II 0 18 (SUTURE) IMPLANT
EVACUATOR SILICONE 100CC (DRAIN) IMPLANT
GAUZE SPONGE 2X2 8PLY STRL LF (GAUZE/BANDAGES/DRESSINGS) ×1 IMPLANT
GLOVE ECLIPSE 8.0 STRL XLNG CF (GLOVE) ×3 IMPLANT
GLOVE INDICATOR 8.0 STRL GRN (GLOVE) ×3 IMPLANT
GOWN STRL REUS W/TWL XL LVL3 (GOWN DISPOSABLE) ×6 IMPLANT
IRRIG SUCT STRYKERFLOW 2 WTIP (MISCELLANEOUS) ×3
IRRIGATION SUCT STRKRFLW 2 WTP (MISCELLANEOUS) ×1 IMPLANT
KIT BASIN OR (CUSTOM PROCEDURE TRAY) ×3 IMPLANT
PAD POSITIONING PINK XL (MISCELLANEOUS) ×3 IMPLANT
POSITIONER SURGICAL ARM (MISCELLANEOUS) IMPLANT
POUCH RETRIEVAL ECOSAC 10 (ENDOMECHANICALS) IMPLANT
POUCH RETRIEVAL ECOSAC 10MM (ENDOMECHANICALS)
SCISSORS LAP 5X35 DISP (ENDOMECHANICALS) ×3 IMPLANT
SET CHOLANGIOGRAPH MIX (MISCELLANEOUS) ×3 IMPLANT
SHEARS HARMONIC ACE PLUS 36CM (ENDOMECHANICALS) ×3 IMPLANT
SPONGE GAUZE 2X2 STER 10/PKG (GAUZE/BANDAGES/DRESSINGS) ×2
SUT MNCRL AB 4-0 PS2 18 (SUTURE) ×3 IMPLANT
SUT PDS AB 1 CT1 27 (SUTURE) ×8 IMPLANT
SYR 20CC LL (SYRINGE) ×3 IMPLANT
TOWEL OR 17X26 10 PK STRL BLUE (TOWEL DISPOSABLE) ×3 IMPLANT
TOWEL OR NON WOVEN STRL DISP B (DISPOSABLE) ×3 IMPLANT
TRAY LAPAROSCOPIC (CUSTOM PROCEDURE TRAY) ×3 IMPLANT
TROCAR BLADELESS OPT 5 100 (ENDOMECHANICALS) ×3 IMPLANT
TROCAR BLADELESS OPT 5 150 (ENDOMECHANICALS) ×3 IMPLANT
TUBING INSUF HEATED (TUBING) ×3 IMPLANT

## 2018-05-09 NOTE — Anesthesia Procedure Notes (Signed)
Procedure Name: Intubation Performed by: Gean Maidens, CRNA Pre-anesthesia Checklist: Patient identified, Emergency Drugs available, Suction available, Patient being monitored and Timeout performed Patient Re-evaluated:Patient Re-evaluated prior to induction Oxygen Delivery Method: Circle system utilized Preoxygenation: Pre-oxygenation with 100% oxygen Induction Type: IV induction Ventilation: Mask ventilation without difficulty Laryngoscope Size: Mac and 4 Grade View: Grade II Tube type: Oral Tube size: 7.5 mm Number of attempts: 1 Airway Equipment and Method: Stylet Placement Confirmation: ETT inserted through vocal cords under direct vision and positive ETCO2 Secured at: 23 cm Tube secured with: Tape Dental Injury: Teeth and Oropharynx as per pre-operative assessment

## 2018-05-09 NOTE — Anesthesia Postprocedure Evaluation (Signed)
Anesthesia Post Note  Patient: Nicholas Riley  Procedure(s) Performed: LAPAROSCOPIC CHOLECYSTECTOMY SINGLE SITE ERAS PATHWAY (N/A Abdomen)     Patient location during evaluation: PACU Anesthesia Type: General Level of consciousness: awake and alert Pain management: pain level controlled Vital Signs Assessment: post-procedure vital signs reviewed and stable Respiratory status: spontaneous breathing, nonlabored ventilation, respiratory function stable and patient connected to nasal cannula oxygen Cardiovascular status: blood pressure returned to baseline and stable Postop Assessment: no apparent nausea or vomiting Anesthetic complications: no    Last Vitals:  Vitals:   05/09/18 1245 05/09/18 1400  BP: (!) 153/77 (!) 155/80  Pulse: (!) 51 (!) 55  Resp: 12 14  Temp: 36.9 C 37 C  SpO2: 94% 96%    Last Pain:  Vitals:   05/09/18 1300  TempSrc:   PainSc: 0-No pain                 Trust [name]

## 2018-05-09 NOTE — Op Note (Signed)
05/09/2018  PATIENT:  Nicholas Riley  52 y.o. male  Patient Care Team: Tally JoeSwayne, David, MD as PCP - General (Family Medicine) Karie SodaGross, Jwan Hornbaker, MD as Consulting Physician (General Surgery)  PRE-OPERATIVE DIAGNOSIS:    Chronic Calculus cholecystitis  POST-OPERATIVE DIAGNOSIS:   Acute on Chronic Calculus Cholecystitis  Liver: normal  PROCEDURE:  SINGLE SITE Laparoscopic cholecystectomy  SURGEON:  Ardeth SportsmanSteven C. Kamara Allan, MD, FACS.  ASSISTANT: RNFA   ANESTHESIA:    General with endotracheal intubation Local anesthetic as a field block  EBL:  (See Anesthesia Intraoperative Record) Total I/O In: 2000 [I.V.:2000] Out: 25 [Blood:25]  Delay start of Pharmacological VTE agent (>24hrs) due to surgical blood loss or risk of bleeding:  no  DRAINS: None   SPECIMEN: Gallbladder    DISPOSITION OF SPECIMEN:  PATHOLOGY  COUNTS:  YES  PLAN OF CARE: Discharge to home after PACU  PATIENT DISPOSITION:  PACU - hemodynamically stable.  INDICATION: Pleasant gentleman episode of biliary colic and gallstones.  The rest of the differential diagnosis seems unlikely.  I offered cholecystectomy  The anatomy & physiology of hepatobiliary & pancreatic function was discussed.  The pathophysiology of gallbladder dysfunction was discussed.  Natural history risks without surgery was discussed.   I feel the risks of no intervention will lead to serious problems that outweigh the operative risks; therefore, I recommended cholecystectomy to remove the pathology.  I explained laparoscopic techniques with possible need for an open approach.  Probable cholangiogram to evaluate the bilary tract was explained as well.    Risks such as bleeding, infection, abscess, leak, injury to other organs, need for further treatment, heart attack, death, and other risks were discussed.  I noted a good likelihood this will help address the problem.  Possibility that this will not correct all abdominal symptoms was explained.  Goals of  post-operative recovery were discussed as well.  We will work to minimize complications.  An educational handout further explaining the pathology and treatment options was given as well.  Questions were answered.  The patient expresses understanding & wishes to proceed with surgery.  OR FINDINGS: Dense adhesions of the gallbladder with acute inflammation consistent with acute on chronic cholecystitis.  Very short cystic duct.  Able to get good critical view, so cholangiogram not done  Liver: normal  DESCRIPTION:    The patient was identified & brought in the operating room. The patient was positioned supine with arms tucked. SCDs were active during the entire case. The patient underwent general anesthesia without any difficulty.  The abdomen was prepped and draped in a sterile fashion. A Surgical Timeout confirmed our plan.  I made a transverse curvilinear incision through the superior umbilical fold.  I placed a 5mm long port through the supraumbilical fascia using a modified Hassan cutdown technique with umbilical stalk fascial countertraction. I began carbon dioxide insufflation.  No change in end tidal CO2 measurement.   Camera inspection revealed no injury. There were no adhesions to the anterior abdominal wall supraumbilically.  I proceeded to continue with single site technique. I placed a #5 port in left upper aspect of the wound. I placed a 5 mm atraumatic grasper in the right inferior aspect of the wound.  I turned attention to the right upper quadrant.  Gallbladder was inflamed with dense adhesions of transverse colon to it.  Was able to free enough off to expose the fundus of the gallbladder.  The gallbladder fundus was elevated cephalad. I freed adhesions to the ventral surface of the gallbladder  off carefully.  Based on blunt hydrodissection as well as harmonic dissection.  Ended up having to decompress the gallbladder and aspirated bile to make it more easily grasped and mobilized.   Eventually freed off adhesions on the ventral surface of the colon and mesocolon.  Also duodenal bulb.  I did freeze some attachments of the liver to the falciform ligament to allow it to be more mobile.  It was quite a large liver lobe on the right side, but it was mobile with no cirrhosis or even fatty change.  With that I can get better mobility and better expose the gallbladder I freed the peritoneal coverings between the gallbladder and the liver on the posteriolateral and anteriomedial walls. I alternated between Harmonic & blunt Maryland dissection to help get a good critical view of the cystic artery and cystic duct.  Gallbladder was thick-walled and rather intrahepatic with a short cystic duct.  Able to get a good critical view around cystic arterial branches and cystic duct.    Therefore, I decided to hold off on doing cholangiography and focus on a dome down dissection.  I did further dissection to free all of the gallbladder off the liver bed to get a good critical view of the infundibulum and cystic duct. I dissected out the cystic artery; and, after getting a good 360 view, ligated the anterior & posterior branches of the cystic artery close on the infundibulum using the Harmonic ultrasonic dissection.  The posterior cystic arterial branch actually was the more dominant one.  From skeletonized the cystic duct and ligated with a 0 PDS Endoloop close to the infundibular side.  Also placed a few clips on the cystic duct.  Transected the gallbladder off the short cystic duct at the level of the infundibulum.  I ensured hemostasis on the gallbladder fossa of the liver and elsewhere.  The hepatic gallbladder fossa was rather friable & oozy, rising setting of acute on chronic cholecystitis; but, eventually I was able to control well with focused spatula cautery.  I inspected the rest of the abdomen & detected no injury nor bleeding elsewhere.  He did copious serial aliquots of irrigation with clear  return after 3 L.  Placed the gallbladder inside and eco-sac.  I removed the gallbladder out the supraumbilical fascia.  Have to open up the fascia to total 2 cm to get the gallbladder was markedly thickened out.  I closed the fascia transversely using  #1 PDS interrupted stitches. I closed the skin using 4-0 monocryl stitch.  Sterile dressing was applied. The patient was extubated & arrived in the PACU in stable condition..  I had discussed postoperative care with the patient in the holding area. I discussed operative findings, updated the patient's status, discussed probable steps to recovery, and gave postoperative recommendations to the patient's family.  Recommendations were made.  Questions were answered.  They expressed understanding & appreciation.  [name], M.D., F.A.C.S. Gastrointestinal and Minimally Invasive Surgery [facility] [address] [place], [place] [address] [phone] Main / Paging  05/09/2018 11:35 AM

## 2018-05-09 NOTE — Interval H&P Note (Signed)
History and Physical Interval Note:  05/09/2018 9:19 AM  Nicholas Riley  has presented today for surgery, with the diagnosis of Chronic cholecystitis  The various methods of treatment have been discussed with the patient and family. After consideration of risks, benefits and other options for treatment, the patient has consented to  Procedure(s): LAPAROSCOPIC CHOLECYSTECTOMY SINGLE SITE WITH INTRAOPERATIVE CHOLANGIOGRAM ERAS PATHWAY (N/A) as a surgical intervention .  The patient's history has been reviewed, patient examined, no change in status, stable for surgery.  I have reviewed the patient's chart and labs.  Questions were answered to the patient's satisfaction.    I have re-reviewed the the patient's records, history, medications, and allergies.  I have re-examined the patient.  I again discussed intraoperative plans and goals of post-operative recovery.  The patient agrees to proceed.  Nicholas Riley  04/18/1966 [id number]  Patient Care Team: Tally JoeSwayne, David, MD as PCP - General (Family Medicine) Karie SodaGross, Shacora Zynda, MD as Consulting Physician (General Surgery)  Patient Active Problem List   Diagnosis Date Noted  . Chronic cholecystitis with calculus 04/23/2018  . OSA (obstructive sleep apnea) 04/23/2018  . Obesity (BMI 30-39.9) 04/23/2018  . Bipolar disorder (HCC) 04/23/2018    Past Medical History:  Diagnosis Date  . Bipolar disorder (HCC) 04/23/2018  . Bradycardia   . Hypertension   . OSA (obstructive sleep apnea) 04/23/2018    Past Surgical History:  Procedure Laterality Date  . KNEE SURGERY      Social History   Socioeconomic History  . Marital status: Married    Spouse name: Not on file  . Number of children: Not on file  . Years of education: Not on file  . Highest education level: Not on file  Occupational History  . Not on file  Social Needs  . Financial resource strain: Not on file  . Food insecurity:    Worry: Not on file    Inability: Not on file  .  Transportation needs:    Medical: Not on file    Non-medical: Not on file  Tobacco Use  . Smoking status: Never Smoker  . Smokeless tobacco: Never Used  Substance and Sexual Activity  . Alcohol use: Not Currently  . Drug use: Never  . Sexual activity: Not on file  Lifestyle  . Physical activity:    Days per week: Not on file    Minutes per session: Not on file  . Stress: Not on file  Relationships  . Social connections:    Talks on phone: Not on file    Gets together: Not on file    Attends religious service: Not on file    Active member of club or organization: Not on file    Attends meetings of clubs or organizations: Not on file    Relationship status: Not on file  . Intimate partner violence:    Fear of current or ex partner: Not on file    Emotionally abused: Not on file    Physically abused: Not on file    Forced sexual activity: Not on file  Other Topics Concern  . Not on file  Social History Narrative  . Not on file    History reviewed. No pertinent family history.  Medications Prior to Admission  Medication Sig Dispense Refill Last Dose  . amLODipine (NORVASC) 5 MG tablet Take 5 mg by mouth daily.   05/09/2018 at 0600  . fluticasone (FLONASE) 50 MCG/ACT nasal spray Place 2 sprays into both nostrils daily.  05/08/2018 at Unknown time  . ibuprofen (ADVIL,MOTRIN) 200 MG tablet Take 400 mg by mouth every 8 (eight) hours as needed for moderate pain.    Past Month at Unknown time  . lamoTRIgine (LAMICTAL) 100 MG tablet Take 100 mg by mouth 2 (two) times daily.    05/09/2018 at 0600  . lithium carbonate 300 MG capsule Take 300 mg by mouth daily. Take 2 capsules (600 mg) in the evening & 1 capsule (300 mg) in the morning.   05/09/2018 at 0600  . losartan (COZAAR) 100 MG tablet Take 100 mg by mouth daily.   05/08/2018 at Unknown time  . ondansetron (ZOFRAN ODT) 8 MG disintegrating tablet Take 1 tablet (8 mg total) by mouth every 8 (eight) hours as needed for nausea or  vomiting. (Patient not taking: Reported on 04/23/2018) 20 tablet 0 Not Taking at Unknown time  . oxyCODONE-acetaminophen (PERCOCET) 5-325 MG tablet Take 1 tablet by mouth every 4 (four) hours as needed for severe pain. (Patient not taking: Reported on 04/23/2018) 30 tablet 0 Not Taking at Unknown time    Current Facility-Administered Medications  Medication Dose Route Frequency Provider Last Rate Last Dose  . bupivacaine liposome (EXPAREL) 1.3 % injection 266 mg  20 mL Infiltration Once Otho Bellows, RPH      . Chlorhexidine Gluconate Cloth 2 % PADS 6 each  6 each Topical Once Karie Soda, MD       And  . Chlorhexidine Gluconate Cloth 2 % PADS 6 each  6 each Topical Once Sumayah Bearse, Viviann Spare, MD      . gabapentin (NEURONTIN) 300 MG capsule           . lactated ringers infusion   Intravenous Continuous Arta Bruce, MD 10 mL/hr at 05/09/18 [phone]       No Known Allergies  BP 133/73 (BP Location: Right Arm)   Pulse (!) 44   Temp 98.2 F (36.8 C) (Oral)   Resp 18   Ht 6' (1.829 m)   Wt 118 kg   SpO2 97%   BMI 35.28 kg/m   Labs: No results found for this or any previous visit (from the past 48 hour(s)).  Imaging / Studies: US Abdomen Limited  Result Date: 04/11/2018 CLINICAL DATA:  Right upper quadrant pain EXAM: ULTRASOUND ABDOMEN LIMITED RIGHT UPPER QUADRANT COMPARISON:  None. FINDINGS: Gallbladder: There is a single mobile gallstone that measures up to 1.1 cm. No pericholecystic fluid or gallbladder wall thickening. Negative sonographic Eulah Pont sign was reported by the sonographer. Common bile duct: Diameter: 5 mm Liver: Liver parenchyma is hyperechoic. Portal vein is patent on color Doppler imaging with normal direction of blood flow towards the liver. IMPRESSION: 1. Cholelithiasis without other evidence of acute cholecystitis. 2. Hepatic steatosis. Electronically Signed   By: Deatra Robinson M.D.   On: 04/11/2018 22:01   Ct Angio Chest/abd/pel For Dissection W And/or W/wo  Result Date:  04/11/2018 CLINICAL DATA:  52 year old male with generalized abdominal pain, shortness and thoracic pain since Monday. Nausea and vomiting. EXAM: CT ANGIOGRAPHY CHEST, ABDOMEN AND PELVIS TECHNIQUE: Multidetector CT imaging through the chest, abdomen and pelvis was performed using the standard protocol during bolus administration of intravenous contrast. Multiplanar reconstructed images and MIPs were obtained and reviewed to evaluate the vascular anatomy. CONTRAST:  ISOVUE-370 IOPAMIDOL (ISOVUE-370) INJECTION 76% COMPARISON:  None. FINDINGS: CTA CHEST FINDINGS Cardiovascular: Conventional branch pattern of the great vessels without dissection nor aneurysm. Nonaneurysmal thoracic aorta without evidence of dissection. Motion related artifacts noted  along the ascending thoracic aorta and aortic root slightly limits assessment. No large central pulmonary embolus identified to the proximal segmental levels. Heart size is normal without pericardial effusion. Coronary arteries demonstrate no significant calcific arteriosclerosis. Mediastinum/Nodes: Hypodense nodules of the left thyroid lobe, the largest in the midpole measuring up to 22 x 19 mm. Midline patent trachea and mainstem bronchi. No lymphadenopathy. Lungs/Pleura: 3.4 mm in average right lo upper lobe posterior pleural-based nodular density potentially postinflammatory or infectious in etiology. Neoplasm believed less likely. No pulmonary consolidation, effusion or pneumothorax. Musculoskeletal: No chest wall abnormality. No acute or significant osseous findings. Review of the MIP images confirms the above findings. CTA ABDOMEN AND PELVIS FINDINGS VASCULAR Aorta: Normal caliber aorta without aneurysm, dissection, vasculitis or significant stenosis. Celiac: Patent without evidence of aneurysm, dissection, vasculitis or significant stenosis. SMA: Patent without evidence of aneurysm, dissection, vasculitis or significant stenosis. Renals: Both renal arteries are  patent without evidence of aneurysm, dissection, vasculitis, fibromuscular dysplasia or significant stenosis. IMA: Patent without evidence of aneurysm, dissection, vasculitis or significant stenosis. Inflow: Patent without evidence of aneurysm, dissection, vasculitis or significant stenosis. Veins: No obvious venous abnormality within the limitations of this arterial phase study. Review of the MIP images confirms the above findings. NON-VASCULAR Hepatobiliary: Steatosis of the liver. Gallbladder wall thickening with pericholecystic inflammation and gallbladder distention is identified. No gallstones however are noted. Pancreas: Normal Spleen: Normal Adrenals/Urinary Tract: Normal bilateral adrenal glands. Tiny too small to characterize hypodensities within both kidneys compatible with cysts. No obstructive uropathy. The urinary bladder is physiologically distended without focal mural thickening or calculi. Stomach/Bowel: The stomach is decompressed. Normal small bowel rotation is identified. Normal appendix. Average amount of fecal retention within the colon without inflammation or obstruction. Scattered colonic diverticulosis is noted without acute diverticulitis. Lymphatic: No lymphadenopathy by CT size criteria. Reproductive: Normal size prostate and seminal vesicles. Other: Fat containing left inguinal canal and small fat containing umbilical hernia. Musculoskeletal: L5-S1 degenerative disc disease. Lipoma of the left pectineus muscle measuring 3.1 x 5.6 x 2.3 cm Review of the MIP images confirms the above findings. IMPRESSION: Vascular: No acute aortic dissection, aneurysm or pulmonary embolus. Nonvascular: 1. Distended gallbladder with mild pericholecystic inflammatory change suspicious for an acalculous cholecystitis. 2. Hepatic steatosis. 3. Subcentimeter hypodensities within both kidneys statistically consistent with cysts but too small to further characterize. No obstructive uropathy. 4. Scattered colonic  diverticulosis without acute diverticulitis. 5. Lipoma of the left pectineus muscle. Electronically Signed   By: Tollie Eth M.D.   On: 04/11/2018 20:46   US Abdomen Limited Ruq  Result Date: 04/13/2018 CLINICAL DATA:  3 day history of abdominal pain EXAM: ULTRASOUND ABDOMEN LIMITED RIGHT UPPER QUADRANT COMPARISON:  04/11/2018 FINDINGS: Gallbladder: Gallstone evident without gallbladder wall thickening or pericholecystic fluid. The sonographer reports no sonographic Murphy sign. Common bile duct: Diameter: 6 mm Liver: Increased parenchymal echotexture compatible with fatty deposition. Portal vein is patent on color Doppler imaging with normal direction of blood flow towards the liver. IMPRESSION: 1. Cholelithiasis without gallbladder wall thickening or pericholecystic fluid. 2. Echogenic liver suggests hepatic steatosis. Electronically Signed   By: Kennith Center M.D.   On: 04/13/2018 02:34     .[name], M.D., F.A.C.S. Gastrointestinal and Minimally Invasive Surgery [facility] [address] [place], [place] [address] [phone] Main / Paging  05/09/2018 9:19 AM    [name]

## 2018-05-09 NOTE — Discharge Instructions (Signed)
LAPAROSCOPIC SURGERY: POST OP INSTRUCTIONS ° °###################################################################### ° °EAT °Gradually transition to a high fiber diet with a fiber supplement over the next few weeks after discharge.  Start with a pureed / full liquid diet (see below) ° °WALK °Walk an hour a day.  Control your pain to do that.   ° °CONTROL PAIN °Control pain so that you can walk, sleep, tolerate sneezing/coughing, go up/down stairs. ° °HAVE A BOWEL MOVEMENT DAILY °Keep your bowels regular to avoid problems.  OK to try a laxative to override constipation.  OK to use an antidairrheal to slow down diarrhea.  Call if not better after 2 tries ° °CALL IF YOU HAVE PROBLEMS/CONCERNS °Call if you are still struggling despite following these instructions. °Call if you have concerns not answered by these instructions ° °###################################################################### ° ° ° °1. DIET: Follow a light bland diet the first 24 hours after arrival home, such as soup, liquids, crackers, etc.  Be sure to include lots of fluids daily.  Avoid fast food or heavy meals as your are more likely to get nauseated.  Eat a low fat the next few days after surgery.   °2. Take your usually prescribed home medications unless otherwise directed. °3. PAIN CONTROL: °a. Pain is best controlled by a usual combination of three different methods TOGETHER: °i. Ice/Heat °ii. Over the counter pain medication °iii. Prescription pain medication °b. Most patients will experience some swelling and bruising around the incisions.  Ice packs or heating pads (30-60 minutes up to 6 times a day) will help. Use ice for the first few days to help decrease swelling and bruising, then switch to heat to help relax tight/sore spots and speed recovery.  Some people prefer to use ice alone, heat alone, alternating between ice & heat.  Experiment to what works for you.  Swelling and bruising can take several weeks to resolve.   °c. It is  helpful to take an over-the-counter pain medication regularly for the first few weeks.  Choose one of the following that works best for you: °i. Naproxen (Aleve, etc)  Two 220mg tabs twice a day °ii. Ibuprofen (Advil, etc) Three 200mg tabs four times a day (every meal & bedtime) °iii. Acetaminophen (Tylenol, etc) 500-650mg four times a day (every meal & bedtime) °d. A  prescription for pain medication (such as oxycodone, hydrocodone, etc) should be given to you upon discharge.  Take your pain medication as prescribed.  °i. If you are having problems/concerns with the prescription medicine (does not control pain, nausea, vomiting, rash, itching, etc), please call us [phone] to see if we need to switch you to a different pain medicine that will work better for you and/or control your side effect better. °ii. If you need a refill on your pain medication, please contact your pharmacy.  They will contact our office to request authorization. Prescriptions will not be filled after 5 pm or on week-ends. °4. Avoid getting constipated.  Between the surgery and the pain medications, it is common to experience some constipation.  Increasing fluid intake and taking a fiber supplement (such as Metamucil, Citrucel, FiberCon, MiraLax, etc) 1-2 times a day regularly will usually help prevent this problem from occurring.  A mild laxative (prune juice, Milk of Magnesia, MiraLax, etc) should be taken according to package directions if there are no bowel movements after 48 hours.   °5. Watch out for diarrhea.  If you have many loose bowel movements, simplify your diet to bland foods & liquids for   a few days.  Stop any stool softeners and decrease your fiber supplement.  Switching to mild anti-diarrheal medications (Kayopectate, Pepto Bismol) can help.  If this worsens or does not improve, please call us. °6. Wash / shower every day.  You may shower over the dressings as they are waterproof.  Continue to shower over incision(s)  after the dressing is off. °7. Remove your waterproof bandages 5 days after surgery.  You may leave the incision open to air.  You may replace a dressing/Band-Aid to cover the incision for comfort if you wish.  °8. ACTIVITIES as tolerated:   °a. You may resume regular (light) daily activities beginning the next day--such as daily self-care, walking, climbing stairs--gradually increasing activities as tolerated.  If you can walk 30 minutes without difficulty, it is safe to try more intense activity such as jogging, treadmill, bicycling, low-impact aerobics, swimming, etc. °b. Save the most intensive and strenuous activity for last such as sit-ups, heavy lifting, contact sports, etc  Refrain from any heavy lifting or straining until you are off narcotics for pain control.   °c. DO NOT PUSH THROUGH PAIN.  Let pain be your guide: If it hurts to do something, don't do it.  Pain is your body warning you to avoid that activity for another week until the pain goes down. °d. You may drive when you are no longer taking prescription pain medication, you can comfortably wear a seatbelt, and you can safely maneuver your car and apply brakes. °e. You may have sexual intercourse when it is comfortable.  °9. FOLLOW UP in our office °a. Please call [facility] at [phone] to set up an appointment to see your surgeon in the office for a follow-up appointment approximately 2-3 weeks after your surgery. °b. Make sure that you call for this appointment the day you arrive home to insure a convenient appointment time. °10. IF YOU HAVE DISABILITY OR FAMILY LEAVE FORMS, BRING THEM TO THE OFFICE FOR PROCESSING.  DO NOT GIVE THEM TO YOUR DOCTOR. ° ° °WHEN TO CALL US [phone]: °1. Poor pain control °2. Reactions / problems with new medications (rash/itching, nausea, etc)  °3. Fever over 101.5 F (38.5 C) °4. Inability to urinate °5. Nausea and/or vomiting °6. Worsening swelling or bruising °7. Continued bleeding from incision. °8. Increased  pain, redness, or drainage from the incision ° ° The clinic staff is available to answer your questions during regular business hours (8:30am-5pm).  Please don’t hesitate to call and ask to speak to one of our nurses for clinical concerns.  ° If you have a medical emergency, go to the nearest emergency room or call 911. ° A surgeon from [facility] is always on call at the hospitals ° ° °[facility] °[address], [address], [place], [place]  [address] ? °MAIN: [phone] ? TOLL FREE: [phone] ?  °FAX [phone] °[email or web address] ° ° ° °Cholecystitis °Cholecystitis is inflammation of the gallbladder. It is often called a gallbladder attack. The gallbladder is a pear-shaped organ that lies beneath the liver on the right side of the body. The gallbladder stores bile, which is a fluid that helps the body to digest fats. If bile builds up in your gallbladder, your gallbladder becomes inflamed. This condition may occur suddenly (be acute). Repeat episodes of acute cholecystitis or prolonged episodes may lead to a long-term (chronic) condition. Cholecystitis is serious and it requires treatment. °What are the causes? °The most common cause of this   condition is gallstones. Gallstones can block the tube (duct) that carries bile out of your gallbladder. This causes bile to build up. Other causes of this condition include: °· Damage to the gallbladder due to a decrease in blood flow. °· Infections in the bile ducts. °· Scars or kinks in the bile ducts. °· Tumors in the liver, pancreas, or gallbladder. ° °What increases the risk? °This condition is more likely to develop in: °· People who have sickle cell disease. °· People who take birth control pills or use estrogen. °· People who have alcoholic liver disease. °· People who have liver cirrhosis. °· People who have their nutrition delivered through a vein (parenteral nutrition). °· People who do not eat or drink  (do fasting) for a long period of time. °· People who are obese. °· People who have rapid weight loss. °· People who are pregnant. °· People who have increased triglyceride levels. °· People who have pancreatitis. ° °What are the signs or symptoms? °Symptoms of this condition include: °· Abdominal pain, especially in the upper right area of the abdomen. °· Abdominal tenderness or bloating. °· Nausea. °· Vomiting. °· Fever. °· Chills. °· Yellowing of the skin and the whites of the eyes (jaundice). ° °How is this diagnosed? °This condition is diagnosed with a medical history and physical exam. You may also have other tests, including: °· Imaging tests, such as: °? An ultrasound of the gallbladder. °? A CT scan of the abdomen. °? A gallbladder nuclear scan (HIDA scan). This scan allows your health care provider to see the bile moving from your liver to your gallbladder and to your small intestine. °? MRI. °· Blood tests, such as: °? A complete blood count, because the white blood cell count may be higher than normal. °? Liver function tests, because some levels may be higher than normal with certain types of gallstones. ° °How is this treated? °Treatment may include: °· Fasting for a certain amount of time. °· IV fluids. °· Medicine to treat pain or vomiting. °· Antibiotic medicine. °· Surgery to remove your gallbladder (cholecystectomy). This may happen immediately or at a later time. ° °Follow these instructions at home: °Home care will depend on your treatment. In general: °· Take over-the-counter and prescription medicines only as told by your health care provider. °· If you were prescribed an antibiotic medicine, take it as told by your health care provider. Do not stop taking the antibiotic even if you start to feel better. °· Follow instructions from your health care provider about what to eat or drink. When you are allowed to eat, avoid eating or drinking anything that triggers your symptoms. °· Keep all  follow-up visits as told by your health care provider. This is important. ° °Contact a health care provider if: °· Your pain is not controlled with medicine. °· You have a fever. °Get help right away if: °· Your pain moves to another part of your abdomen or to your back. °· You continue to have symptoms or you develop new symptoms even with treatment. °This information is not intended to replace advice given to you by your health care provider. Make sure you discuss any questions you have with your health care provider. °Document Released: 08/28/2005 Document Revised: 01/06/2016 Document Reviewed: 12/09/2014 °Elsevier Interactive Patient Education © 2018 [employer] ° °

## 2018-05-09 NOTE — Anesthesia Preprocedure Evaluation (Signed)
Anesthesia Evaluation  Patient identified by MRN, date of birth, ID band Patient awake    Reviewed: Allergy & Precautions, NPO status , Patient's Chart, lab work & pertinent test results  Airway Mallampati: II  TM Distance: >3 FB Neck ROM: Full    Dental   Pulmonary sleep apnea and Continuous Positive Airway Pressure Ventilation ,    Pulmonary exam normal        Cardiovascular hypertension, Pt. on medications Normal cardiovascular exam     Neuro/Psych Bipolar Disorder    GI/Hepatic   Endo/Other    Renal/GU      Musculoskeletal   Abdominal   Peds  Hematology   Anesthesia Other Findings   Reproductive/Obstetrics                             Anesthesia Physical Anesthesia Plan  ASA: III  Anesthesia Plan: General   Post-op Pain Management:    Induction: Intravenous  PONV Risk Score and Plan: 2 and Ondansetron, Midazolam and Treatment may vary due to age or medical condition  Airway Management Planned: Oral ETT  Additional Equipment:   Intra-op Plan:   Post-operative Plan: Extubation in OR  Informed Consent: I have reviewed the patients History and Physical, chart, labs and discussed the procedure including the risks, benefits and alternatives for the proposed anesthesia with the patient or authorized representative who has indicated his/her understanding and acceptance.     Plan Discussed with: CRNA and Surgeon  Anesthesia Plan Comments:         Anesthesia Quick Evaluation

## 2018-05-09 NOTE — Transfer of Care (Signed)
Immediate Anesthesia Transfer of Care Note  Patient: Nicholas Riley  Procedure(s) Performed: LAPAROSCOPIC CHOLECYSTECTOMY SINGLE SITE ERAS PATHWAY (N/A Abdomen)  Patient Location: PACU  Anesthesia Type:General  Level of Consciousness: awake, alert  and oriented  Airway & Oxygen Therapy: Patient Spontanous Breathing and Patient connected to face mask oxygen  Post-op Assessment: Report given to RN and Post -op Vital signs reviewed and stable  Post vital signs: Reviewed and stable  Last Vitals:  Vitals Value Taken Time  BP 189/85 05/09/2018 11:54 AM  Temp    Pulse 57 05/09/2018 11:56 AM  Resp 18 05/09/2018 11:56 AM  SpO2 98 % 05/09/2018 11:56 AM  Vitals shown include unvalidated device data.  Last Pain:  Vitals:   05/09/18 0812  TempSrc:   PainSc: 0-No pain         Complications: No apparent anesthesia complications

## 2018-05-10 ENCOUNTER — Encounter (HOSPITAL_COMMUNITY): Payer: Self-pay | Admitting: Surgery

## 2018-08-15 DIAGNOSIS — G4733 Obstructive sleep apnea (adult) (pediatric): Secondary | ICD-10-CM | POA: Diagnosis not present

## 2018-08-28 DIAGNOSIS — G4733 Obstructive sleep apnea (adult) (pediatric): Secondary | ICD-10-CM | POA: Diagnosis not present

## 2018-09-16 DIAGNOSIS — I10 Essential (primary) hypertension: Secondary | ICD-10-CM | POA: Diagnosis not present

## 2018-09-16 DIAGNOSIS — F3181 Bipolar II disorder: Secondary | ICD-10-CM | POA: Diagnosis not present

## 2018-09-16 DIAGNOSIS — E782 Mixed hyperlipidemia: Secondary | ICD-10-CM | POA: Diagnosis not present

## 2018-09-16 DIAGNOSIS — R001 Bradycardia, unspecified: Secondary | ICD-10-CM | POA: Diagnosis not present

## 2018-09-28 DIAGNOSIS — G4733 Obstructive sleep apnea (adult) (pediatric): Secondary | ICD-10-CM | POA: Diagnosis not present

## 2018-10-29 DIAGNOSIS — G4733 Obstructive sleep apnea (adult) (pediatric): Secondary | ICD-10-CM | POA: Diagnosis not present

## 2018-11-21 DIAGNOSIS — G4733 Obstructive sleep apnea (adult) (pediatric): Secondary | ICD-10-CM | POA: Diagnosis not present

## 2018-11-27 DIAGNOSIS — G4733 Obstructive sleep apnea (adult) (pediatric): Secondary | ICD-10-CM | POA: Diagnosis not present

## 2018-12-28 DIAGNOSIS — G4733 Obstructive sleep apnea (adult) (pediatric): Secondary | ICD-10-CM | POA: Diagnosis not present

## 2019-01-27 DIAGNOSIS — G4733 Obstructive sleep apnea (adult) (pediatric): Secondary | ICD-10-CM | POA: Diagnosis not present

## 2019-02-27 DIAGNOSIS — G4733 Obstructive sleep apnea (adult) (pediatric): Secondary | ICD-10-CM | POA: Diagnosis not present

## 2019-03-12 DIAGNOSIS — Z1159 Encounter for screening for other viral diseases: Secondary | ICD-10-CM | POA: Diagnosis not present

## 2019-03-12 DIAGNOSIS — Z125 Encounter for screening for malignant neoplasm of prostate: Secondary | ICD-10-CM | POA: Diagnosis not present

## 2019-03-12 DIAGNOSIS — E782 Mixed hyperlipidemia: Secondary | ICD-10-CM | POA: Diagnosis not present

## 2019-03-12 DIAGNOSIS — Z0000 Encounter for general adult medical examination without abnormal findings: Secondary | ICD-10-CM | POA: Diagnosis not present

## 2019-03-12 DIAGNOSIS — F3181 Bipolar II disorder: Secondary | ICD-10-CM | POA: Diagnosis not present

## 2019-03-12 DIAGNOSIS — Z79899 Other long term (current) drug therapy: Secondary | ICD-10-CM | POA: Diagnosis not present

## 2019-03-24 DIAGNOSIS — L819 Disorder of pigmentation, unspecified: Secondary | ICD-10-CM | POA: Diagnosis not present

## 2019-03-24 DIAGNOSIS — L814 Other melanin hyperpigmentation: Secondary | ICD-10-CM | POA: Diagnosis not present

## 2019-03-24 DIAGNOSIS — D229 Melanocytic nevi, unspecified: Secondary | ICD-10-CM | POA: Diagnosis not present

## 2019-03-24 DIAGNOSIS — L821 Other seborrheic keratosis: Secondary | ICD-10-CM | POA: Diagnosis not present

## 2019-03-29 DIAGNOSIS — G4733 Obstructive sleep apnea (adult) (pediatric): Secondary | ICD-10-CM | POA: Diagnosis not present

## 2019-04-29 DIAGNOSIS — G4733 Obstructive sleep apnea (adult) (pediatric): Secondary | ICD-10-CM | POA: Diagnosis not present

## 2019-05-30 DIAGNOSIS — G4733 Obstructive sleep apnea (adult) (pediatric): Secondary | ICD-10-CM | POA: Diagnosis not present

## 2019-06-03 ENCOUNTER — Other Ambulatory Visit: Payer: Self-pay

## 2019-06-03 DIAGNOSIS — R6889 Other general symptoms and signs: Secondary | ICD-10-CM | POA: Diagnosis not present

## 2019-06-03 DIAGNOSIS — Z20822 Contact with and (suspected) exposure to covid-19: Secondary | ICD-10-CM

## 2019-06-04 LAB — SPECIMEN STATUS REPORT

## 2019-06-04 LAB — NOVEL CORONAVIRUS, NAA: SARS-CoV-2, NAA: NOT DETECTED

## 2019-07-14 DIAGNOSIS — G4733 Obstructive sleep apnea (adult) (pediatric): Secondary | ICD-10-CM | POA: Diagnosis not present

## 2019-09-15 DIAGNOSIS — I10 Essential (primary) hypertension: Secondary | ICD-10-CM | POA: Diagnosis not present

## 2019-09-15 DIAGNOSIS — J309 Allergic rhinitis, unspecified: Secondary | ICD-10-CM | POA: Diagnosis not present

## 2019-09-15 DIAGNOSIS — E782 Mixed hyperlipidemia: Secondary | ICD-10-CM | POA: Diagnosis not present

## 2019-09-15 DIAGNOSIS — F3181 Bipolar II disorder: Secondary | ICD-10-CM | POA: Diagnosis not present

## 2019-09-16 DIAGNOSIS — E782 Mixed hyperlipidemia: Secondary | ICD-10-CM | POA: Diagnosis not present

## 2019-09-16 DIAGNOSIS — F3181 Bipolar II disorder: Secondary | ICD-10-CM | POA: Diagnosis not present

## 2019-12-31 DIAGNOSIS — G4733 Obstructive sleep apnea (adult) (pediatric): Secondary | ICD-10-CM | POA: Diagnosis not present

## 2020-04-16 DIAGNOSIS — G4733 Obstructive sleep apnea (adult) (pediatric): Secondary | ICD-10-CM | POA: Diagnosis not present

## 2020-04-19 DIAGNOSIS — Z03818 Encounter for observation for suspected exposure to other biological agents ruled out: Secondary | ICD-10-CM | POA: Diagnosis not present

## 2020-04-19 DIAGNOSIS — Z20822 Contact with and (suspected) exposure to covid-19: Secondary | ICD-10-CM | POA: Diagnosis not present

## 2020-05-27 DIAGNOSIS — H1789 Other corneal scars and opacities: Secondary | ICD-10-CM | POA: Diagnosis not present

## 2020-05-27 DIAGNOSIS — H5203 Hypermetropia, bilateral: Secondary | ICD-10-CM | POA: Diagnosis not present

## 2020-06-08 IMAGING — CT CT ANGIO CHEST-ABD-PELV FOR DISSECTION W/ AND WO/W CM
2 of 8 series · 12 of 46 positions shown, 14 images · IV contrast (iopamidol)
Comparison: None.

CLINICAL DATA: 52-year-old male with generalized abdominal pain,
shortness and thoracic pain since [REDACTED]. Nausea and vomiting.

EXAM:
CT ANGIOGRAPHY CHEST, ABDOMEN AND PELVIS
TECHNIQUE: Multidetector CT imaging through the chest, abdomen and pelvis was
performed using the standard protocol during bolus administration of
intravenous contrast. Multiplanar reconstructed images and MIPs were
obtained and reviewed to evaluate the vascular anatomy.
CONTRAST:  100mL 2GIRY9-PGH IOPAMIDOL (2GIRY9-PGH) INJECTION 76%

[Series 5: axial arterial · axial · arterial · 0.61mm/px · z∈[-190,+350]mm · 9 of 226 slices shown, 11 images]
[im 23/226  soft-tissue]
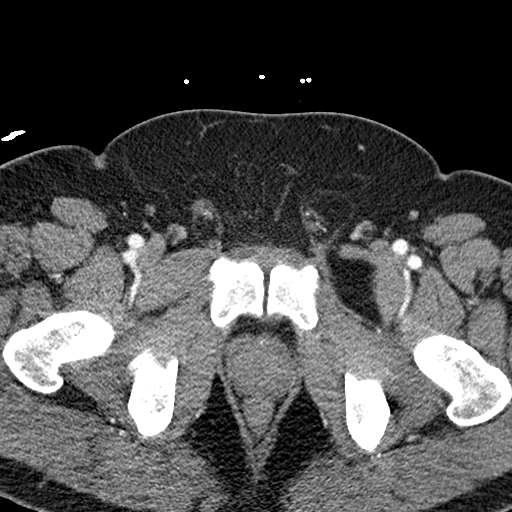
[im 23/226  bone]
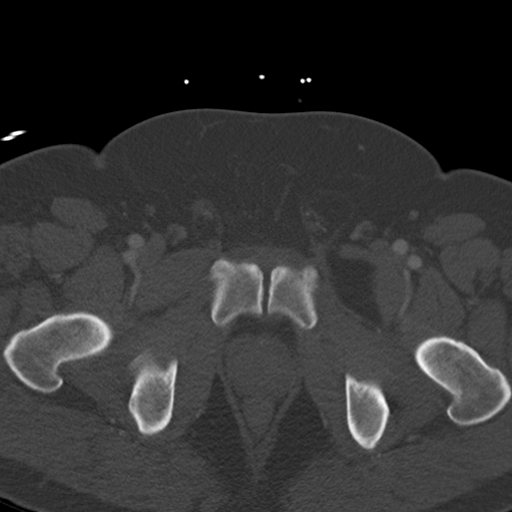
[im 46/226  soft-tissue]
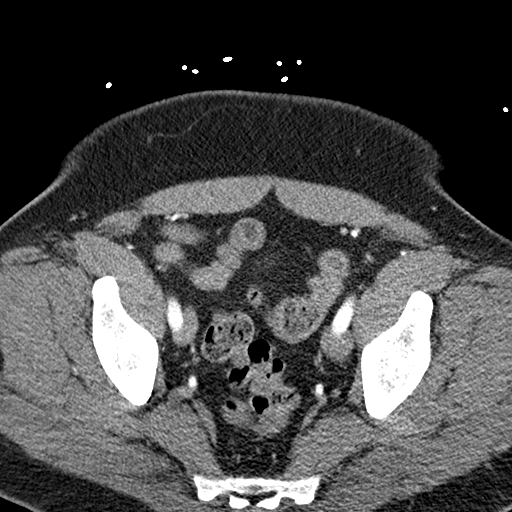
[im 68/226  soft-tissue]
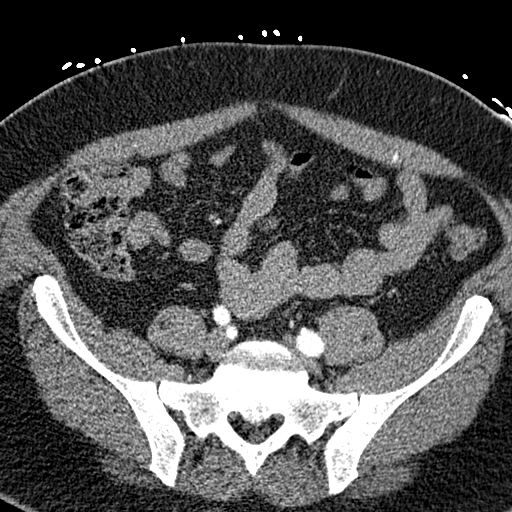
[im 91/226  soft-tissue]
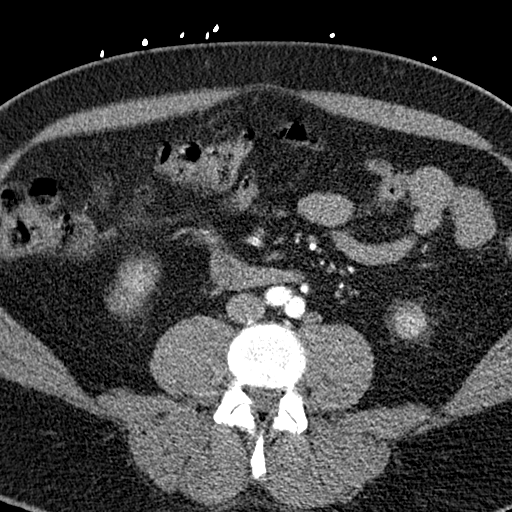
[im 113/226  soft-tissue]
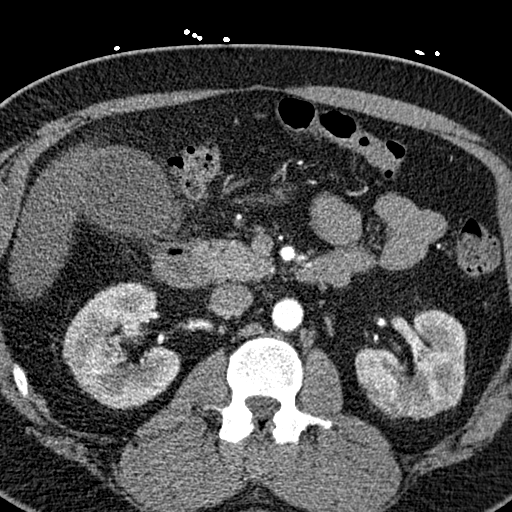
[im 136/226  soft-tissue]
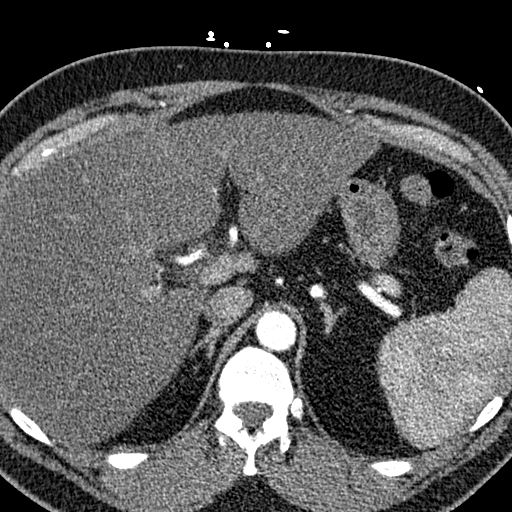
[im 158/226  soft-tissue]
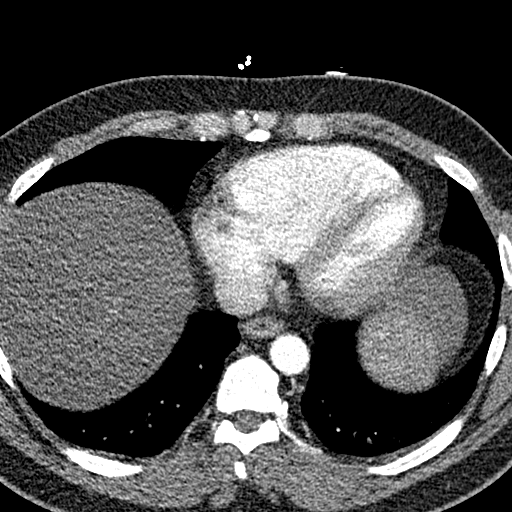
[im 181/226  soft-tissue]
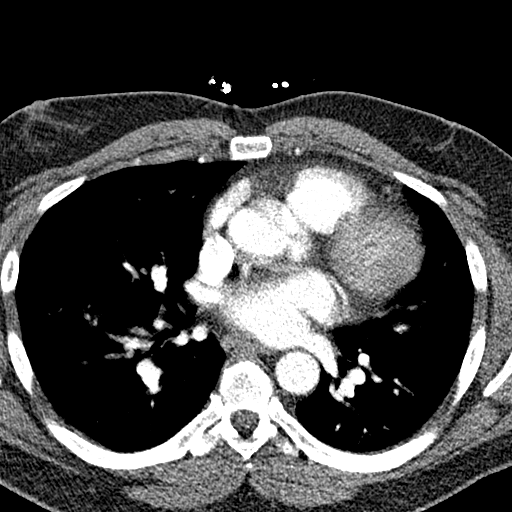
[im 203/226  soft-tissue]
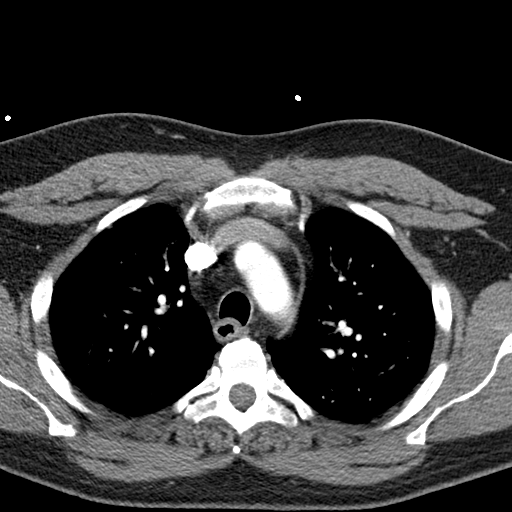
[im 203/226  bone]
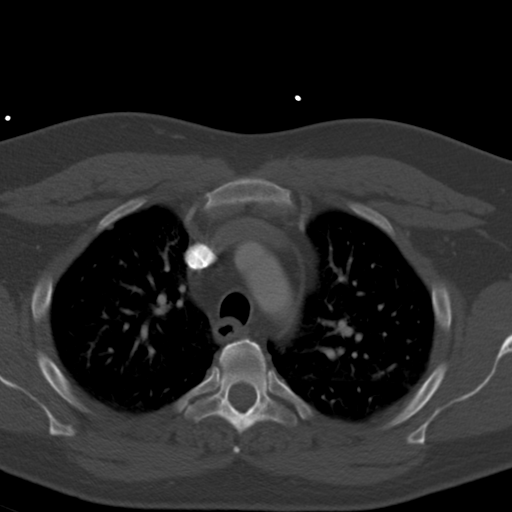

[Series 7: coronals · coronal · 0.88mm/px · 3 of 168 slices shown]
[im 42/168  soft-tissue]
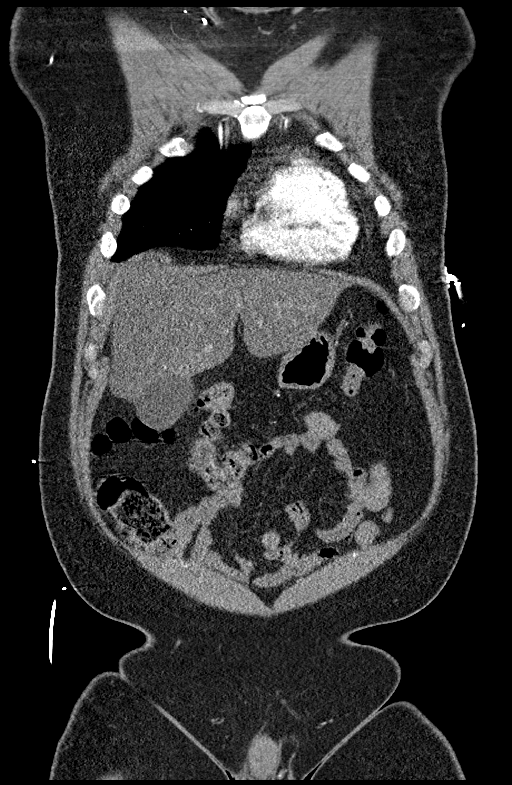
[im 84/168  soft-tissue]
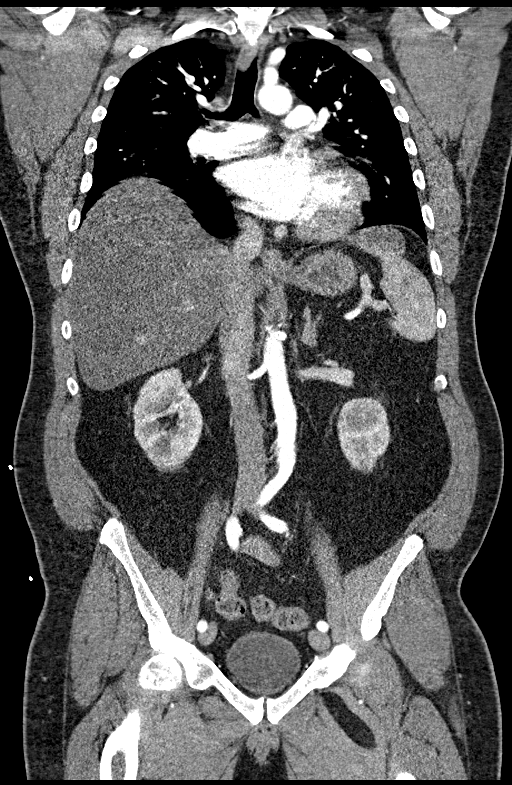
[im 126/168  soft-tissue]
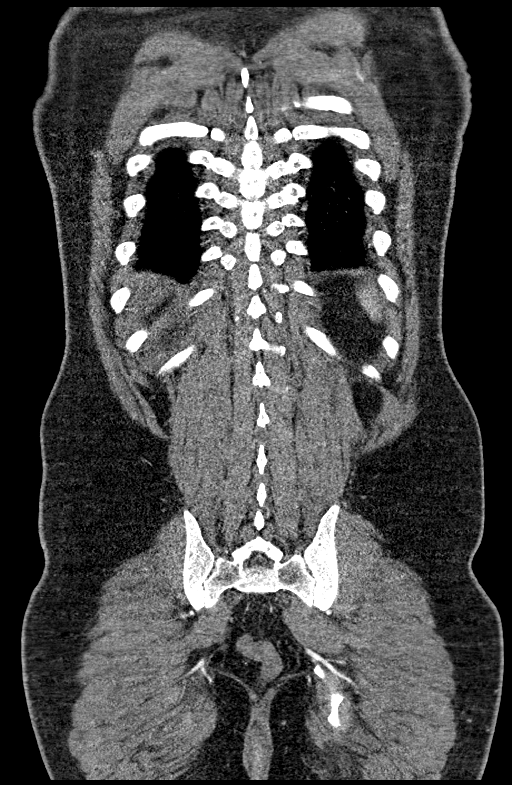

[12 of 46 positions shown; findings below may reference images not displayed]

FINDINGS: CTA CHEST FINDINGS

Cardiovascular: Conventional branch pattern of the great vessels
without dissection nor aneurysm. Nonaneurysmal thoracic aorta
without evidence of dissection. Motion related artifacts noted along
the ascending thoracic aorta and aortic root slightly limits
assessment. No large central pulmonary embolus identified to the
proximal segmental levels. Heart size is normal without pericardial
effusion. Coronary arteries demonstrate no significant calcific
arteriosclerosis.

Mediastinum/Nodes: Hypodense nodules of the left thyroid lobe, the
largest in the midpole measuring up to 22 x 19 mm. Midline patent
trachea and mainstem bronchi. No lymphadenopathy.

Lungs/Pleura: 3.4 mm in average right lo upper lobe posterior
pleural-based nodular density potentially postinflammatory or
infectious in etiology. Neoplasm believed less likely. No pulmonary
consolidation, effusion or pneumothorax.

Musculoskeletal: No chest wall abnormality. No acute or significant
osseous findings.

Review of the MIP images confirms the above findings.

CTA ABDOMEN AND PELVIS FINDINGS

VASCULAR

Aorta: Normal caliber aorta without aneurysm, dissection, vasculitis
or significant stenosis.

Celiac: Patent without evidence of aneurysm, dissection, vasculitis
or significant stenosis.

SMA: Patent without evidence of aneurysm, dissection, vasculitis or
significant stenosis.

Renals: Both renal arteries are patent without evidence of aneurysm,
dissection, vasculitis, fibromuscular dysplasia or significant
stenosis.

IMA: Patent without evidence of aneurysm, dissection, vasculitis or
significant stenosis.

Inflow: Patent without evidence of aneurysm, dissection, vasculitis
or significant stenosis.

Veins: No obvious venous abnormality within the limitations of this
arterial phase study.

Review of the MIP images confirms the above findings.

NON-VASCULAR

Hepatobiliary: Steatosis of the liver. Gallbladder wall thickening
with pericholecystic inflammation and gallbladder distention is
identified. No gallstones however are noted.

Pancreas: Normal

Spleen: Normal

Adrenals/Urinary Tract: Normal bilateral adrenal glands. Tiny too
small to characterize hypodensities within both kidneys compatible
with cysts. No obstructive uropathy. The urinary bladder is
physiologically distended without focal mural thickening or calculi.

Stomach/Bowel: The stomach is decompressed. Normal small bowel
rotation is identified. Normal appendix. Average amount of fecal
retention within the colon without inflammation or obstruction.
Scattered colonic diverticulosis is noted without acute
diverticulitis.

Lymphatic: No lymphadenopathy by CT size criteria.

Reproductive: Normal size prostate and seminal vesicles.

Other: Fat containing left inguinal canal and small fat containing
umbilical hernia.

Musculoskeletal: L5-S1 degenerative disc disease. Lipoma of the left
pectineus muscle measuring 3.1 x 5.6 x 2.3 cm

Review of the MIP images confirms the above findings.
IMPRESSION: Vascular:

No acute aortic dissection, aneurysm or pulmonary embolus.

Nonvascular:

1. Distended gallbladder with mild pericholecystic inflammatory
change suspicious for an acalculous cholecystitis.
2. Hepatic steatosis.
3. Subcentimeter hypodensities within both kidneys statistically
consistent with cysts but too small to further characterize. No
obstructive uropathy.
4. Scattered colonic diverticulosis without acute diverticulitis.
5. Lipoma of the left pectineus muscle.

## 2020-06-10 IMAGING — US US ABDOMEN LIMITED
1 series · 14 of 25 positions shown · non-contrast
Comparison: 04/11/2018

CLINICAL DATA: 3 day history of abdominal pain

EXAM:
ULTRASOUND ABDOMEN LIMITED RIGHT UPPER QUADRANT

[Series 1: us abdomen limited · 14 of 59 slices shown]
[im 1/59]
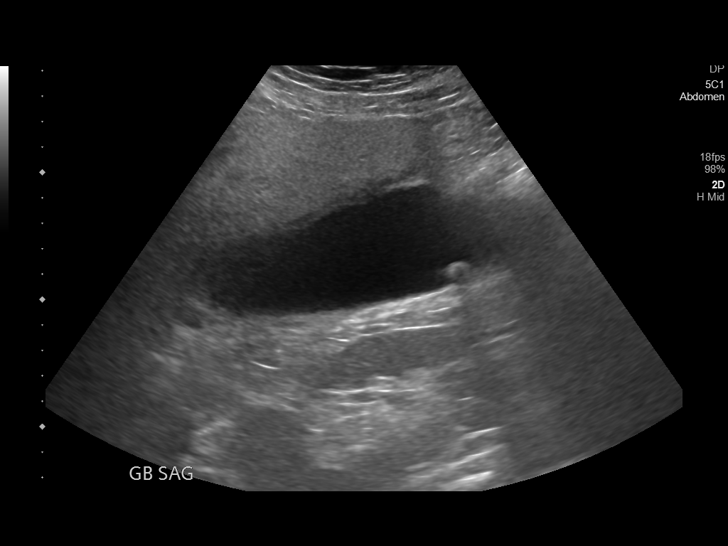
[im 5/59]
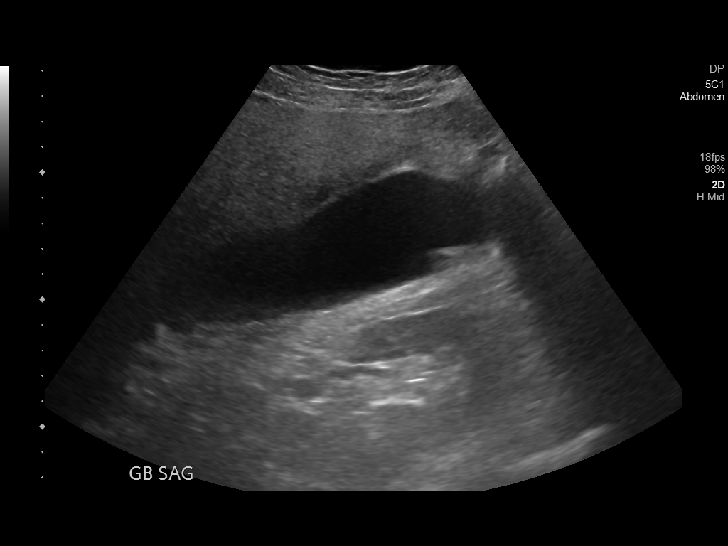
[im 10/59]
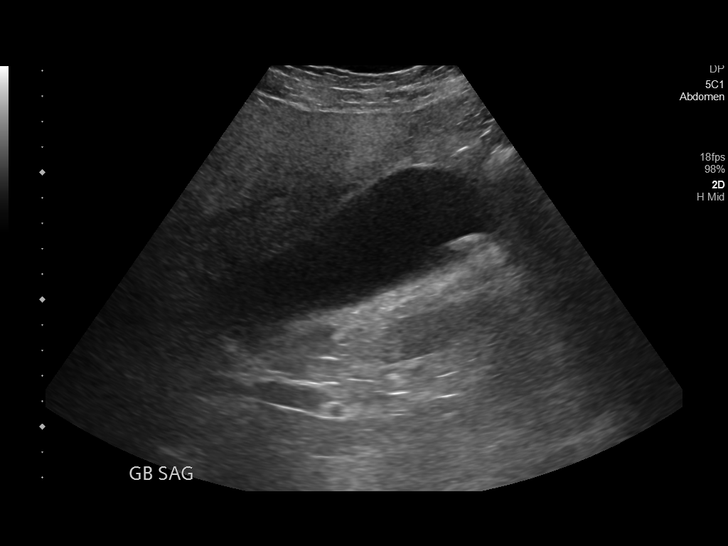
[im 15/59]
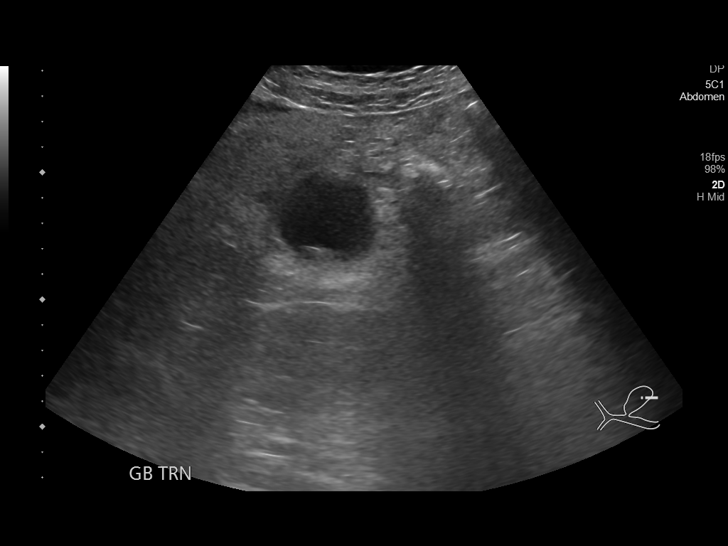
[im 20/59]
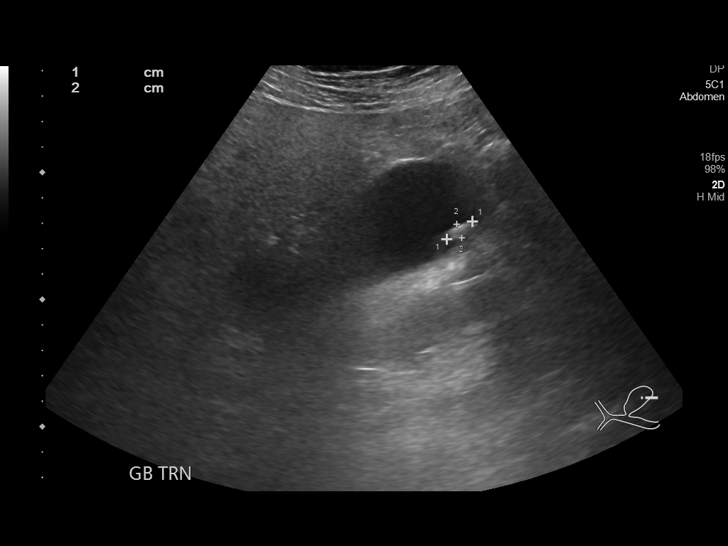
[im 22/59]
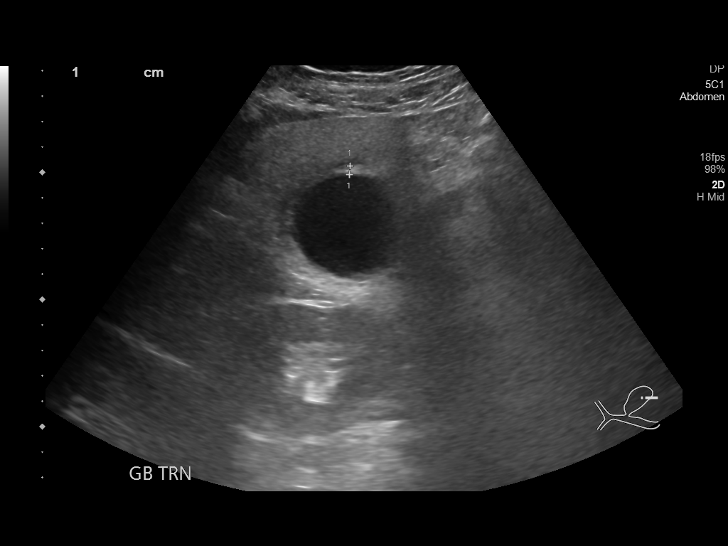
[im 27/59]
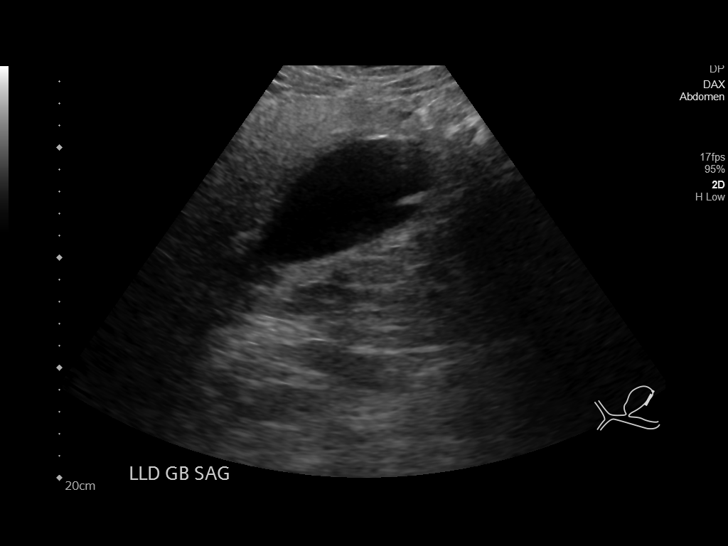
[im 32/59]
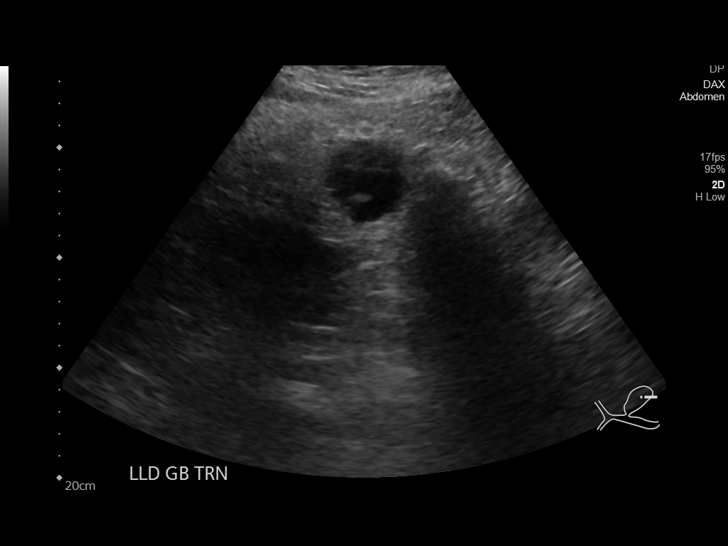
[im 37/59]
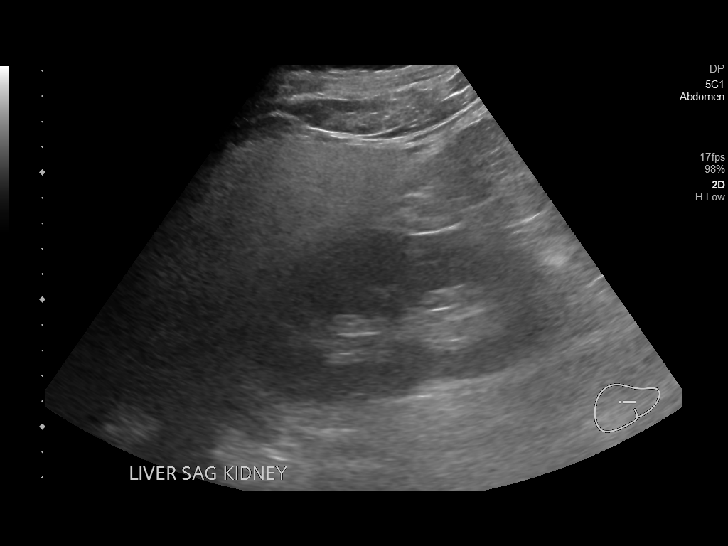
[im 39/59]
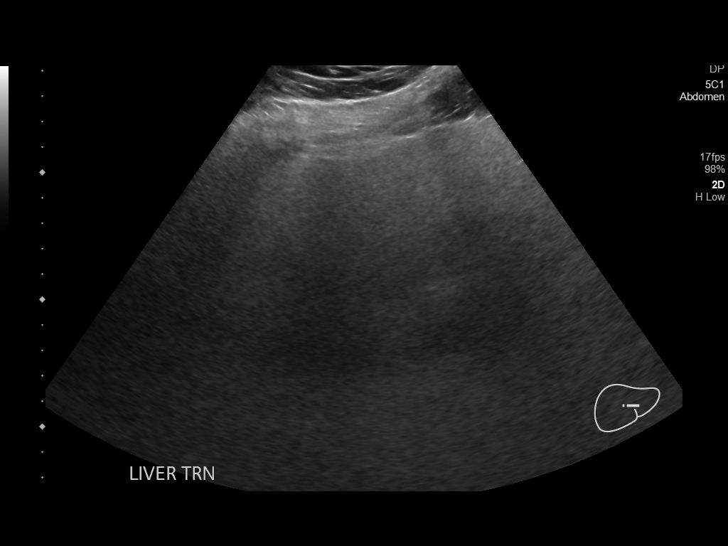
[im 44/59]
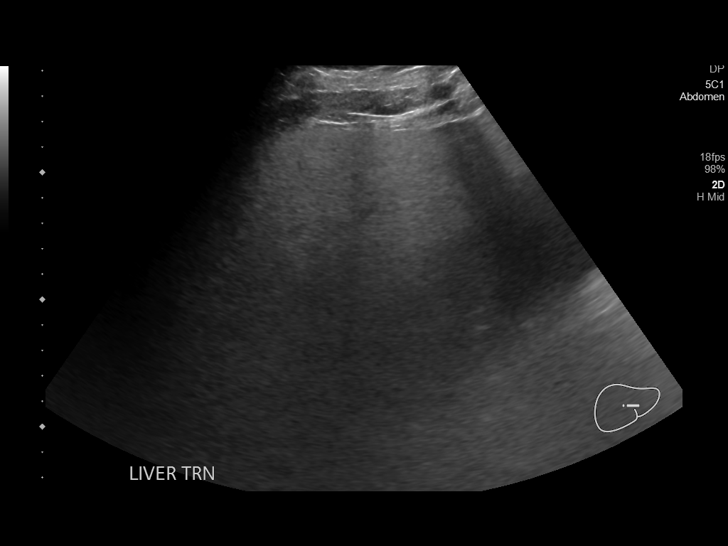
[im 49/59]
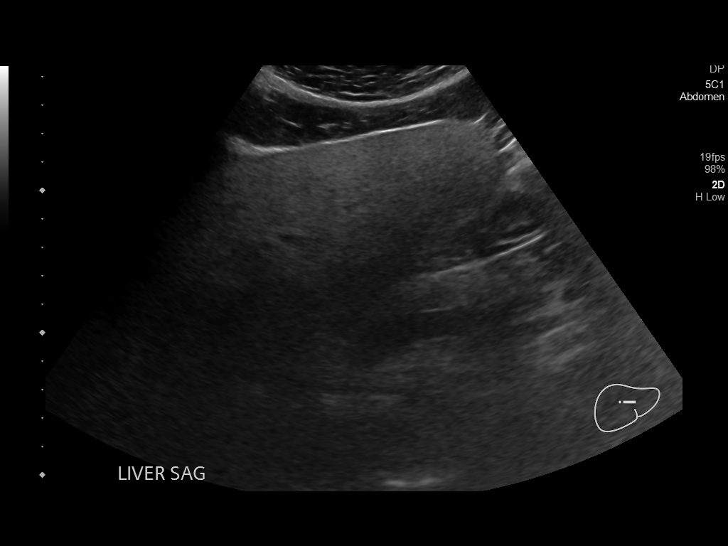
[im 54/59]
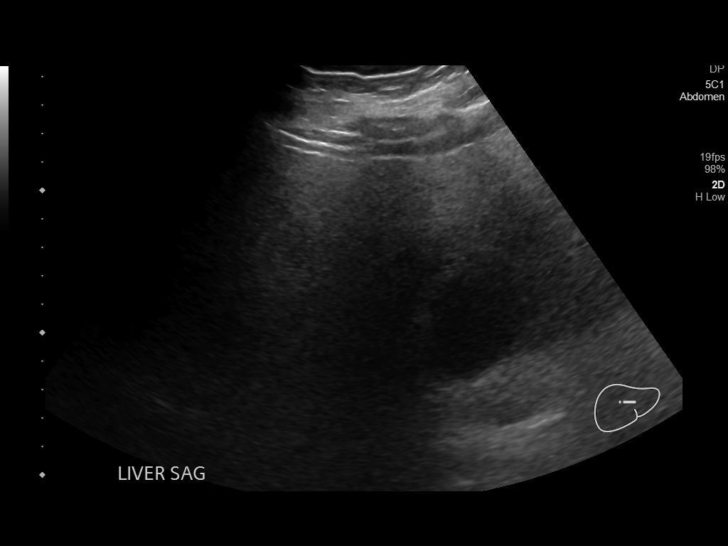
[im 59/59]
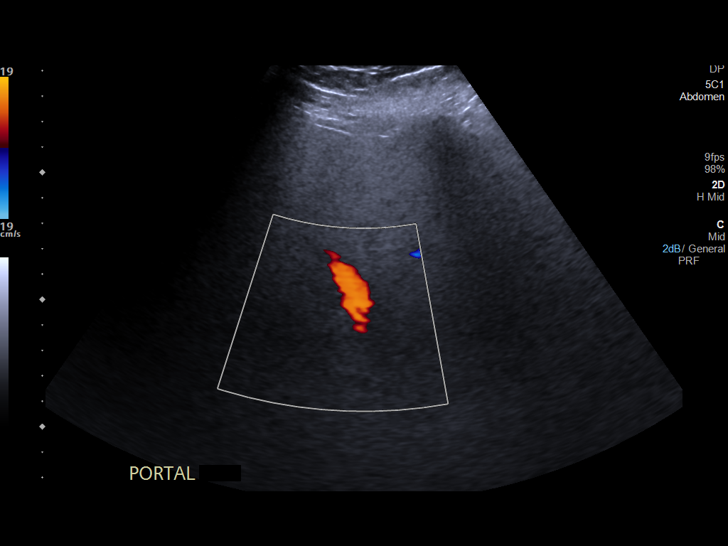

[14 of 25 positions shown; findings below may reference images not displayed]

FINDINGS: Gallbladder:

Gallstone evident without gallbladder wall thickening or
pericholecystic fluid. The sonographer reports no sonographic Murphy
sign.

Common bile duct:

Diameter: 6 mm

Liver:

Increased parenchymal echotexture compatible with fatty deposition.
Portal vein is patent on color Doppler imaging with normal direction
of blood flow towards the liver.
IMPRESSION: 1. Cholelithiasis without gallbladder wall thickening or
pericholecystic fluid.
2. Echogenic liver suggests hepatic steatosis.

## 2020-08-06 DIAGNOSIS — G4733 Obstructive sleep apnea (adult) (pediatric): Secondary | ICD-10-CM | POA: Diagnosis not present

## 2020-09-29 DIAGNOSIS — Z1152 Encounter for screening for COVID-19: Secondary | ICD-10-CM | POA: Diagnosis not present

## 2020-10-05 DIAGNOSIS — F3181 Bipolar II disorder: Secondary | ICD-10-CM | POA: Diagnosis not present

## 2020-10-05 DIAGNOSIS — I10 Essential (primary) hypertension: Secondary | ICD-10-CM | POA: Diagnosis not present

## 2020-10-05 DIAGNOSIS — Z125 Encounter for screening for malignant neoplasm of prostate: Secondary | ICD-10-CM | POA: Diagnosis not present

## 2020-10-05 DIAGNOSIS — E782 Mixed hyperlipidemia: Secondary | ICD-10-CM | POA: Diagnosis not present

## 2020-10-05 DIAGNOSIS — Z0000 Encounter for general adult medical examination without abnormal findings: Secondary | ICD-10-CM | POA: Diagnosis not present

## 2020-10-05 DIAGNOSIS — R001 Bradycardia, unspecified: Secondary | ICD-10-CM | POA: Diagnosis not present

## 2020-11-05 DIAGNOSIS — G4733 Obstructive sleep apnea (adult) (pediatric): Secondary | ICD-10-CM | POA: Diagnosis not present

## 2020-11-30 DIAGNOSIS — G4733 Obstructive sleep apnea (adult) (pediatric): Secondary | ICD-10-CM | POA: Diagnosis not present

## 2021-03-10 DIAGNOSIS — G4733 Obstructive sleep apnea (adult) (pediatric): Secondary | ICD-10-CM | POA: Diagnosis not present

## 2021-03-22 DIAGNOSIS — T85618A Breakdown (mechanical) of other specified internal prosthetic devices, implants and grafts, initial encounter: Secondary | ICD-10-CM | POA: Diagnosis not present

## 2021-04-15 DIAGNOSIS — G4733 Obstructive sleep apnea (adult) (pediatric): Secondary | ICD-10-CM | POA: Diagnosis not present
